# Patient Record
Sex: Male | Born: 1985 | Race: White | Hispanic: No | Smoking: Current every day smoker
Health system: Southern US, Community
[De-identification: ages and names within clinical notes are randomized; demographics above are authoritative.]

---

## 2020-06-16 ENCOUNTER — Other Ambulatory Visit: Payer: Self-pay

## 2020-06-16 ENCOUNTER — Emergency Department (HOSPITAL_COMMUNITY): Payer: BC Managed Care – PPO

## 2020-06-16 ENCOUNTER — Encounter (HOSPITAL_COMMUNITY): Payer: Self-pay | Admitting: Emergency Medicine

## 2020-06-16 ENCOUNTER — Inpatient Hospital Stay (HOSPITAL_COMMUNITY)
Admission: EM | Admit: 2020-06-16 | Discharge: 2020-06-25 | DRG: 987 | Disposition: A | Discharge status: Home Health | Payer: BC Managed Care – PPO | Source: Other Acute Inpatient Hospital | Attending: Family Medicine | Admitting: Family Medicine

## 2020-06-16 DIAGNOSIS — F10239 Alcohol dependence with withdrawal, unspecified: Secondary | ICD-10-CM

## 2020-06-16 DIAGNOSIS — F10131 Alcohol abuse with withdrawal delirium: Principal | ICD-10-CM | POA: Diagnosis present

## 2020-06-16 DIAGNOSIS — E871 Hypo-osmolality and hyponatremia: Secondary | ICD-10-CM

## 2020-06-16 DIAGNOSIS — R17 Unspecified jaundice: Secondary | ICD-10-CM

## 2020-06-16 DIAGNOSIS — F17200 Nicotine dependence, unspecified, uncomplicated: Secondary | ICD-10-CM | POA: Diagnosis present

## 2020-06-16 DIAGNOSIS — Z79899 Other long term (current) drug therapy: Secondary | ICD-10-CM

## 2020-06-16 DIAGNOSIS — E876 Hypokalemia: Secondary | ICD-10-CM

## 2020-06-16 DIAGNOSIS — K766 Portal hypertension: Secondary | ICD-10-CM | POA: Diagnosis present

## 2020-06-16 DIAGNOSIS — K709 Alcoholic liver disease, unspecified: Secondary | ICD-10-CM | POA: Diagnosis present

## 2020-06-16 DIAGNOSIS — K859 Acute pancreatitis without necrosis or infection, unspecified: Secondary | ICD-10-CM

## 2020-06-16 DIAGNOSIS — G9341 Metabolic encephalopathy: Secondary | ICD-10-CM | POA: Diagnosis present

## 2020-06-16 DIAGNOSIS — Z23 Encounter for immunization: Secondary | ICD-10-CM

## 2020-06-16 DIAGNOSIS — D539 Nutritional anemia, unspecified: Secondary | ICD-10-CM

## 2020-06-16 DIAGNOSIS — D649 Anemia, unspecified: Secondary | ICD-10-CM

## 2020-06-16 DIAGNOSIS — F10939 Alcohol use, unspecified with withdrawal, unspecified: Secondary | ICD-10-CM

## 2020-06-16 DIAGNOSIS — G9340 Encephalopathy, unspecified: Secondary | ICD-10-CM | POA: Diagnosis present

## 2020-06-16 DIAGNOSIS — F1020 Alcohol dependence, uncomplicated: Secondary | ICD-10-CM

## 2020-06-16 DIAGNOSIS — Y900 Blood alcohol level of less than 20 mg/100 ml: Secondary | ICD-10-CM | POA: Diagnosis present

## 2020-06-16 DIAGNOSIS — Z20822 Contact with and (suspected) exposure to covid-19: Secondary | ICD-10-CM | POA: Diagnosis present

## 2020-06-16 DIAGNOSIS — D696 Thrombocytopenia, unspecified: Secondary | ICD-10-CM

## 2020-06-16 DIAGNOSIS — D61818 Other pancytopenia: Secondary | ICD-10-CM | POA: Diagnosis present

## 2020-06-16 DIAGNOSIS — S0181XA Laceration without foreign body of other part of head, initial encounter: Secondary | ICD-10-CM

## 2020-06-16 LAB — COMPREHENSIVE METABOLIC PANEL
ALT: 34 U/L (ref 0–44)
AST: 107 U/L — ABNORMAL HIGH (ref 15–41)
Albumin: 3.6 g/dL (ref 3.5–5.0)
Alkaline Phosphatase: 94 U/L (ref 38–126)
Anion gap: 9 (ref 5–15)
BUN: 8 mg/dL (ref 6–20)
CO2: 25 mmol/L (ref 22–32)
Calcium: 9 mg/dL (ref 8.9–10.3)
Chloride: 98 mmol/L (ref 98–111)
Creatinine, Ser: 0.55 mg/dL — ABNORMAL LOW (ref 0.61–1.24)
GFR, Estimated: >60 mL/min (ref >60)
Glucose, Bld: 94 mg/dL (ref 70–99)
Potassium: 3.2 mmol/L — ABNORMAL LOW (ref 3.5–5.1)
Sodium: 132 mmol/L — ABNORMAL LOW (ref 135–145)
Total Bilirubin: 8.7 mg/dL — ABNORMAL HIGH (ref 0.3–1.2)
Total Protein: 8.2 g/dL — ABNORMAL HIGH (ref 6.5–8.1)

## 2020-06-16 LAB — RESP PANEL BY RT-PCR (FLU A&B, COVID) ARPGX2
Influenza A by PCR: NEGATIVE
Influenza B by PCR: NEGATIVE
SARS Coronavirus 2 by RT PCR: NEGATIVE

## 2020-06-16 LAB — ACETAMINOPHEN LEVEL: Acetaminophen (Tylenol), Serum: <10 ug/mL — ABNORMAL LOW (ref 10–30)

## 2020-06-16 LAB — CBC
HCT: 19.7 % — ABNORMAL LOW (ref 39.0–52.0)
Hemoglobin: 6.6 g/dL — CL (ref 13.0–17.0)
MCH: 35.1 pg — ABNORMAL HIGH (ref 26.0–34.0)
MCHC: 33.5 g/dL (ref 30.0–36.0)
MCV: 104.8 fL — ABNORMAL HIGH (ref 80.0–100.0)
Platelets: 16 10*3/uL — CL (ref 150–400)
RBC: 1.88 MIL/uL — ABNORMAL LOW (ref 4.22–5.81)
RDW: 15.8 % — ABNORMAL HIGH (ref 11.5–15.5)
WBC: 3.8 10*3/uL — ABNORMAL LOW (ref 4.0–10.5)
nRBC: 0 % (ref 0.0–0.2)

## 2020-06-16 LAB — ETHANOL: Alcohol, Ethyl (B): <10 mg/dL (ref <10)

## 2020-06-16 LAB — POC OCCULT BLOOD, ED: Fecal Occult Bld: NEGATIVE

## 2020-06-16 LAB — SALICYLATE LEVEL: Salicylate Lvl: <7 mg/dL — ABNORMAL LOW (ref 7.0–30.0)

## 2020-06-16 MED ORDER — ONDANSETRON HCL 4 MG/2ML IJ SOLN
INTRAMUSCULAR | Status: AC
  Administered 2020-06-16: 4 mg via INTRAVENOUS
  Filled 2020-06-16: qty 2

## 2020-06-16 MED ORDER — THIAMINE HCL 100 MG PO TABS: 100.0000 mg | ORAL_TABLET | Freq: Every day | ORAL | Status: DC

## 2020-06-16 MED ORDER — LORAZEPAM 2 MG/ML IJ SOLN
0.0000 mg | Freq: Four times a day (QID) | INTRAMUSCULAR | Status: AC
  Administered 2020-06-16: 2 mg via INTRAVENOUS
  Administered 2020-06-17: 4 mg via INTRAVENOUS
  Administered 2020-06-18: 1 mg via INTRAVENOUS
  Administered 2020-06-18 (×2): 2 mg via INTRAVENOUS
  Filled 2020-06-16: qty 1
  Filled 2020-06-16: qty 2
  Filled 2020-06-16 (×3): qty 1

## 2020-06-16 MED ORDER — LORAZEPAM 2 MG/ML IJ SOLN: 0.0000 mg | Freq: Two times a day (BID) | INTRAMUSCULAR | Status: AC

## 2020-06-16 MED ORDER — ONDANSETRON HCL 4 MG/2ML IJ SOLN: 4.0000 mg | Freq: Once | INTRAMUSCULAR | Status: AC

## 2020-06-16 MED ORDER — THIAMINE HCL 100 MG/ML IJ SOLN
100.0000 mg | Freq: Every day | INTRAMUSCULAR | Status: DC
  Administered 2020-06-16: 100 mg via INTRAVENOUS
  Filled 2020-06-16: qty 2

## 2020-06-16 MED ORDER — SODIUM CHLORIDE 0.9% IV SOLUTION: Freq: Once | INTRAVENOUS | Status: AC

## 2020-06-16 MED ORDER — LORAZEPAM 1 MG PO TABS
0.0000 mg | ORAL_TABLET | Freq: Two times a day (BID) | ORAL | Status: AC
  Administered 2020-06-19: 1 mg via ORAL
  Administered 2020-06-20: 2 mg via ORAL
  Filled 2020-06-16: qty 4
  Filled 2020-06-16: qty 1

## 2020-06-16 MED ORDER — LORAZEPAM 1 MG PO TABS
0.0000 mg | ORAL_TABLET | Freq: Four times a day (QID) | ORAL | Status: AC
  Administered 2020-06-17 (×2): 1 mg via ORAL
  Filled 2020-06-16 (×2): qty 1

## 2020-06-16 MED ORDER — TETANUS-DIPHTH-ACELL PERTUSSIS 5-2.5-18.5 LF-MCG/0.5 IM SUSY
0.5000 mL | PREFILLED_SYRINGE | Freq: Once | INTRAMUSCULAR | Status: AC
  Administered 2020-06-17: 0.5 mL via INTRAMUSCULAR
  Filled 2020-06-16: qty 0.5

## 2020-06-16 NOTE — ED Notes (Signed)
Patient has tremors and is stuttering. Patients last drink was Saturday. Patient was sent from a rehab facility.

## 2020-06-16 NOTE — ED Triage Notes (Signed)
Brought in by EMS from [facility]hab [facility] Been there since Saturday for alcohol use, pt has not had any alcohol since Saturday. Been on librium and valium for DTs. Valium at 1600. Hx of seizures when going through alcohol withdrawals, no seizures with this withdrawal episode.
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
## 2020-06-17 ENCOUNTER — Encounter (HOSPITAL_COMMUNITY): Payer: Self-pay | Admitting: Family Medicine

## 2020-06-17 ENCOUNTER — Inpatient Hospital Stay (HOSPITAL_COMMUNITY): Payer: BC Managed Care – PPO

## 2020-06-17 DIAGNOSIS — F10939 Alcohol use, unspecified with withdrawal, unspecified: Secondary | ICD-10-CM

## 2020-06-17 DIAGNOSIS — Z79899 Other long term (current) drug therapy: Secondary | ICD-10-CM | POA: Diagnosis not present

## 2020-06-17 DIAGNOSIS — K709 Alcoholic liver disease, unspecified: Secondary | ICD-10-CM | POA: Diagnosis present

## 2020-06-17 DIAGNOSIS — F17200 Nicotine dependence, unspecified, uncomplicated: Secondary | ICD-10-CM | POA: Diagnosis present

## 2020-06-17 DIAGNOSIS — G9341 Metabolic encephalopathy: Secondary | ICD-10-CM | POA: Diagnosis present

## 2020-06-17 DIAGNOSIS — Z20822 Contact with and (suspected) exposure to covid-19: Secondary | ICD-10-CM | POA: Diagnosis present

## 2020-06-17 DIAGNOSIS — K766 Portal hypertension: Secondary | ICD-10-CM | POA: Diagnosis present

## 2020-06-17 DIAGNOSIS — Z23 Encounter for immunization: Secondary | ICD-10-CM | POA: Diagnosis not present

## 2020-06-17 DIAGNOSIS — G9340 Encephalopathy, unspecified: Secondary | ICD-10-CM | POA: Diagnosis present

## 2020-06-17 DIAGNOSIS — D539 Nutritional anemia, unspecified: Secondary | ICD-10-CM | POA: Diagnosis present

## 2020-06-17 DIAGNOSIS — S0181XA Laceration without foreign body of other part of head, initial encounter: Secondary | ICD-10-CM | POA: Diagnosis present

## 2020-06-17 DIAGNOSIS — F10131 Alcohol abuse with withdrawal delirium: Secondary | ICD-10-CM | POA: Diagnosis present

## 2020-06-17 DIAGNOSIS — E876 Hypokalemia: Secondary | ICD-10-CM

## 2020-06-17 DIAGNOSIS — K859 Acute pancreatitis without necrosis or infection, unspecified: Secondary | ICD-10-CM

## 2020-06-17 DIAGNOSIS — E871 Hypo-osmolality and hyponatremia: Secondary | ICD-10-CM

## 2020-06-17 DIAGNOSIS — F10239 Alcohol dependence with withdrawal, unspecified: Secondary | ICD-10-CM | POA: Diagnosis not present

## 2020-06-17 DIAGNOSIS — D61818 Other pancytopenia: Secondary | ICD-10-CM | POA: Diagnosis present

## 2020-06-17 DIAGNOSIS — D696 Thrombocytopenia, unspecified: Secondary | ICD-10-CM

## 2020-06-17 DIAGNOSIS — Y900 Blood alcohol level of less than 20 mg/100 ml: Secondary | ICD-10-CM | POA: Diagnosis present

## 2020-06-17 DIAGNOSIS — R17 Unspecified jaundice: Secondary | ICD-10-CM

## 2020-06-17 LAB — TSH: TSH: 1.276 u[IU]/mL (ref 0.350–4.500)

## 2020-06-17 LAB — COMPREHENSIVE METABOLIC PANEL
ALT: 37 U/L (ref 0–44)
AST: 109 U/L — ABNORMAL HIGH (ref 15–41)
Albumin: 3.8 g/dL (ref 3.5–5.0)
Alkaline Phosphatase: 98 U/L (ref 38–126)
Anion gap: 10 (ref 5–15)
BUN: 8 mg/dL (ref 6–20)
CO2: 26 mmol/L (ref 22–32)
Calcium: 9.6 mg/dL (ref 8.9–10.3)
Chloride: 97 mmol/L — ABNORMAL LOW (ref 98–111)
Creatinine, Ser: 0.71 mg/dL (ref 0.61–1.24)
GFR, Estimated: >60 mL/min (ref >60)
Glucose, Bld: 106 mg/dL — ABNORMAL HIGH (ref 70–99)
Potassium: 3.2 mmol/L — ABNORMAL LOW (ref 3.5–5.1)
Sodium: 133 mmol/L — ABNORMAL LOW (ref 135–145)
Total Bilirubin: 9 mg/dL — ABNORMAL HIGH (ref 0.3–1.2)
Total Protein: 8.4 g/dL — ABNORMAL HIGH (ref 6.5–8.1)

## 2020-06-17 LAB — PROTIME-INR
INR: 1.9 — ABNORMAL HIGH (ref 0.8–1.2)
Prothrombin Time: 22.1 seconds — ABNORMAL HIGH (ref 11.4–15.2)

## 2020-06-17 LAB — URINALYSIS, ROUTINE W REFLEX MICROSCOPIC
Bilirubin Urine: NEGATIVE
Glucose, UA: NEGATIVE mg/dL
Hgb urine dipstick: NEGATIVE
Ketones, ur: NEGATIVE mg/dL
Leukocytes,Ua: NEGATIVE
Nitrite: NEGATIVE
Protein, ur: NEGATIVE mg/dL
Specific Gravity, Urine: 1.008 (ref 1.005–1.030)
pH: 8 (ref 5.0–8.0)

## 2020-06-17 LAB — DIFFERENTIAL
Abs Immature Granulocytes: 0.03 10*3/uL (ref 0.00–0.07)
Basophils Absolute: 0 10*3/uL (ref 0.0–0.1)
Basophils Relative: 1 %
Eosinophils Absolute: 0.1 10*3/uL (ref 0.0–0.5)
Eosinophils Relative: 2 %
Immature Granulocytes: 1 %
Lymphocytes Relative: 23 %
Lymphs Abs: 0.9 10*3/uL (ref 0.7–4.0)
Monocytes Absolute: 0.6 10*3/uL (ref 0.1–1.0)
Monocytes Relative: 14 %
Neutro Abs: 2.4 10*3/uL (ref 1.7–7.7)
Neutrophils Relative %: 59 %

## 2020-06-17 LAB — RAPID URINE DRUG SCREEN, HOSP PERFORMED
Amphetamines: NOT DETECTED
Barbiturates: NOT DETECTED
Benzodiazepines: POSITIVE — AB
Cocaine: NOT DETECTED
Opiates: NOT DETECTED
Tetrahydrocannabinol: POSITIVE — AB

## 2020-06-17 LAB — LIPASE, BLOOD: Lipase: 67 U/L — ABNORMAL HIGH (ref 11–51)

## 2020-06-17 LAB — CBC
HCT: 19.7 % — ABNORMAL LOW (ref 39.0–52.0)
HCT: 22.6 % — ABNORMAL LOW (ref 39.0–52.0)
Hemoglobin: 6.6 g/dL — CL (ref 13.0–17.0)
Hemoglobin: 7.7 g/dL — ABNORMAL LOW (ref 13.0–17.0)
MCH: 34 pg (ref 26.0–34.0)
MCH: 33.6 pg (ref 26.0–34.0)
MCHC: 33.5 g/dL (ref 30.0–36.0)
MCHC: 34.1 g/dL (ref 30.0–36.0)
MCV: 101.5 fL — ABNORMAL HIGH (ref 80.0–100.0)
MCV: 98.7 fL (ref 80.0–100.0)
Platelets: 19 10*3/uL — CL (ref 150–400)
Platelets: 38 10*3/uL — ABNORMAL LOW (ref 150–400)
RBC: 1.94 MIL/uL — ABNORMAL LOW (ref 4.22–5.81)
RBC: 2.29 MIL/uL — ABNORMAL LOW (ref 4.22–5.81)
RDW: 15.8 % — ABNORMAL HIGH (ref 11.5–15.5)
RDW: 19.7 % — ABNORMAL HIGH (ref 11.5–15.5)
WBC: 4.4 10*3/uL (ref 4.0–10.5)
WBC: 4.6 10*3/uL (ref 4.0–10.5)
nRBC: 0 % (ref 0.0–0.2)
nRBC: 0 % (ref 0.0–0.2)

## 2020-06-17 LAB — GLUCOSE, CAPILLARY: Glucose-Capillary: 218 mg/dL — ABNORMAL HIGH (ref 70–99)

## 2020-06-17 LAB — APTT: aPTT: 41 seconds — ABNORMAL HIGH (ref 24–36)

## 2020-06-17 LAB — HIV ANTIBODY (ROUTINE TESTING W REFLEX): HIV Screen 4th Generation wRfx: NONREACTIVE

## 2020-06-17 LAB — VITAMIN B12: Vitamin B-12: 1248 pg/mL — ABNORMAL HIGH (ref 180–914)

## 2020-06-17 LAB — PREPARE RBC (CROSSMATCH)

## 2020-06-17 LAB — FOLATE: Folate: 7.6 ng/mL (ref >5.9)

## 2020-06-17 LAB — MAGNESIUM: Magnesium: 1.2 mg/dL — ABNORMAL LOW (ref 1.7–2.4)

## 2020-06-17 LAB — ABO/RH: ABO/RH(D): O POS

## 2020-06-17 MED ORDER — LORAZEPAM 1 MG PO TABS
1.0000 mg | ORAL_TABLET | ORAL | Status: AC | PRN
  Administered 2020-06-17: 2 mg via ORAL
  Administered 2020-06-19: 1 mg via ORAL
  Filled 2020-06-17: qty 2
  Filled 2020-06-17: qty 1

## 2020-06-17 MED ORDER — ADULT MULTIVITAMIN W/MINERALS CH
1.0000 | ORAL_TABLET | Freq: Every day | ORAL | Status: DC
  Administered 2020-06-17 – 2020-06-25 (×9): 1 via ORAL
  Filled 2020-06-17 (×9): qty 1

## 2020-06-17 MED ORDER — SERTRALINE HCL 50 MG PO TABS
50.0000 mg | ORAL_TABLET | Freq: Every day | ORAL | Status: DC
  Administered 2020-06-17 – 2020-06-25 (×9): 50 mg via ORAL
  Filled 2020-06-17 (×9): qty 1

## 2020-06-17 MED ORDER — ONDANSETRON HCL 4 MG PO TABS: 4.0000 mg | ORAL_TABLET | Freq: Four times a day (QID) | ORAL | Status: DC | PRN

## 2020-06-17 MED ORDER — SODIUM CHLORIDE 0.9 % IV SOLN: INTRAVENOUS | Status: DC

## 2020-06-17 MED ORDER — SODIUM CHLORIDE 0.9 % IV SOLN
8.0000 mg/h | INTRAVENOUS | Status: DC
  Administered 2020-06-17 – 2020-06-19 (×5): 8 mg/h via INTRAVENOUS
  Filled 2020-06-17 (×5): qty 80

## 2020-06-17 MED ORDER — POTASSIUM CHLORIDE CRYS ER 20 MEQ PO TBCR
40.0000 meq | EXTENDED_RELEASE_TABLET | Freq: Once | ORAL | Status: AC
  Administered 2020-06-17: 40 meq via ORAL
  Filled 2020-06-17: qty 2

## 2020-06-17 MED ORDER — LORAZEPAM 2 MG/ML IJ SOLN
6.0000 mg | Freq: Once | INTRAMUSCULAR | Status: DC
  Filled 2020-06-17: qty 3

## 2020-06-17 MED ORDER — CHLORDIAZEPOXIDE HCL 25 MG PO CAPS
25.0000 mg | ORAL_CAPSULE | Freq: Three times a day (TID) | ORAL | Status: DC
  Administered 2020-06-17 – 2020-06-25 (×26): 25 mg via ORAL
  Filled 2020-06-17 (×10): qty 1
  Filled 2020-06-17: qty 5
  Filled 2020-06-17 (×16): qty 1

## 2020-06-17 MED ORDER — ACETAMINOPHEN 325 MG PO TABS
650.0000 mg | ORAL_TABLET | Freq: Four times a day (QID) | ORAL | Status: DC | PRN
  Administered 2020-06-25: 650 mg via ORAL
  Filled 2020-06-17: qty 2

## 2020-06-17 MED ORDER — SODIUM CHLORIDE 0.9 % IV SOLN
80.0000 mg | Freq: Once | INTRAVENOUS | Status: AC
  Administered 2020-06-17: 80 mg via INTRAVENOUS
  Filled 2020-06-17: qty 80

## 2020-06-17 MED ORDER — TRAZODONE HCL 50 MG PO TABS
50.0000 mg | ORAL_TABLET | Freq: Every day | ORAL | Status: DC
  Administered 2020-06-17 – 2020-06-24 (×8): 50 mg via ORAL
  Filled 2020-06-17 (×8): qty 1

## 2020-06-17 MED ORDER — SODIUM CHLORIDE 0.9 % IV SOLN
50.0000 ug/h | INTRAVENOUS | Status: DC
  Administered 2020-06-17 – 2020-06-19 (×5): 50 ug/h via INTRAVENOUS
  Filled 2020-06-17 (×6): qty 1

## 2020-06-17 MED ORDER — FOLIC ACID 1 MG PO TABS
1.0000 mg | ORAL_TABLET | Freq: Every day | ORAL | Status: DC
  Administered 2020-06-17 – 2020-06-25 (×9): 1 mg via ORAL
  Filled 2020-06-17 (×9): qty 1

## 2020-06-17 MED ORDER — NICOTINE 14 MG/24HR TD PT24
14.0000 mg | MEDICATED_PATCH | Freq: Every day | TRANSDERMAL | Status: DC
  Administered 2020-06-18 – 2020-06-25 (×8): 14 mg via TRANSDERMAL
  Filled 2020-06-17 (×9): qty 1

## 2020-06-17 MED ORDER — THIAMINE HCL 100 MG/ML IJ SOLN: 100.0000 mg | Freq: Every day | INTRAMUSCULAR | Status: DC

## 2020-06-17 MED ORDER — PANTOPRAZOLE SODIUM 40 MG IV SOLR
40.0000 mg | Freq: Two times a day (BID) | INTRAVENOUS | Status: DC
  Administered 2020-06-20 – 2020-06-22 (×4): 40 mg via INTRAVENOUS
  Filled 2020-06-17 (×4): qty 40

## 2020-06-17 MED ORDER — THIAMINE HCL 100 MG PO TABS
100.0000 mg | ORAL_TABLET | Freq: Every day | ORAL | Status: DC
  Administered 2020-06-17 – 2020-06-25 (×9): 100 mg via ORAL
  Filled 2020-06-17 (×9): qty 1

## 2020-06-17 MED ORDER — MAGNESIUM SULFATE 4 GM/100ML IV SOLN
4.0000 g | Freq: Once | INTRAVENOUS | Status: AC
  Administered 2020-06-17: 4 g via INTRAVENOUS
  Filled 2020-06-17: qty 100

## 2020-06-17 MED ORDER — HALOPERIDOL LACTATE 5 MG/ML IJ SOLN
5.0000 mg | Freq: Once | INTRAMUSCULAR | Status: AC
  Administered 2020-06-17: 5 mg via INTRAMUSCULAR
  Filled 2020-06-17: qty 1

## 2020-06-17 MED ORDER — LACTATED RINGERS IV SOLN: INTRAVENOUS | Status: DC

## 2020-06-17 MED ORDER — LORAZEPAM 2 MG/ML IJ SOLN
1.0000 mg | INTRAMUSCULAR | Status: AC | PRN
  Administered 2020-06-17: 3 mg via INTRAVENOUS
  Administered 2020-06-17: 4 mg via INTRAVENOUS
  Administered 2020-06-18 – 2020-06-20 (×3): 1 mg via INTRAVENOUS
  Filled 2020-06-17 (×2): qty 1
  Filled 2020-06-17: qty 2
  Filled 2020-06-17: qty 1

## 2020-06-17 MED ORDER — ONDANSETRON HCL 4 MG/2ML IJ SOLN: 4.0000 mg | Freq: Four times a day (QID) | INTRAMUSCULAR | Status: DC | PRN

## 2020-06-17 MED ORDER — ACETAMINOPHEN 650 MG RE SUPP: 650.0000 mg | Freq: Four times a day (QID) | RECTAL | Status: DC | PRN

## 2020-06-17 NOTE — ED Notes (Signed)
Platelets started at [phone]

## 2020-06-17 NOTE — ED Notes (Signed)
Pt unable to sign blood consent due to AMS. This RN and charge RN signed blood consent

## 2020-06-17 NOTE — ED Notes (Signed)
Notified Md of ciwa score. MD gave verbal order to give patient 6mg  of ativan

## 2020-06-17 NOTE — ED Notes (Signed)
MD notified of Ciwa Score

## 2020-06-17 NOTE — ED Notes (Signed)
[facility] called to check on to see if patient is admitted yet. Explained waiting on a bed

## 2020-06-17 NOTE — Progress Notes (Signed)
Notified by RN that pt very agitated and pulling off telemetry monitor, pulled out IV.  Has been given ativan but not helping.  Given Haldol 5 mg IM now.

## 2020-06-17 NOTE — Progress Notes (Signed)
Brief same-day note  Patient is a 35 year old male with history of chronic alcohol abuse who was sent from alcohol detox facility due to concerns for DTs.  He did not respond to Librium and Valium at detox facility.  He was drinking 10 glasses of bourbon a day. On presentation he was hemodynamically stable.  He was found to have multiple bruises on his extremities and around his body.  Multiple imagings done did not show any acute finding.  Liver imaging showed splenomegaly, portal hypertension, fatty infiltration versus diffuse hepatocellular disease.  CT abdomen showed possible mild acute pancreatitis, he had mild elevated lipase Lab work showed hemoglobin in the range of 6, severe thrombocytopenia of 16,000.  PRBC, platelets were transfused.  Lab work also showed hemoglobin of 8.  UDS was positive for marijuana and benzos. Patient was admitted for further management.  He was started on CIWA protocol.  We have requested GI consultation today for evaluation of possible underlying esophageal varices.  His hemoglobin was 11.5 on 04/28/2020.  GI planning for EGD, currently he is on clear liquid diet. Patient seen and examined at the bedside this morning.  During my evaluation, he was hemodynamically stable, sedated/sleeping.  Mom was at the bedside. Examination revealed mildly distended abdomen, lungs were clear. He has been started on octreotide and Protonix drip.  We will continue current management

## 2020-06-17 NOTE — ED Notes (Signed)
No signs of transfusion reaction noted within first 15 minutes of platelet administration. Infusion is to be administered within 1 hour of starting per order. Rate increased to 319mL/hr to infuse remaining in 45 minutes

## 2020-06-17 NOTE — ED Notes (Signed)
Will check cbc 4hrs post transfusion per MD [facility]
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
## 2020-06-17 NOTE — ED Notes (Signed)
Patient has a swollen right elbow. Elbow is purple and blue. Patient has a right black eye. Patient has bruised knees. Patient has multiple bruises all over body.

## 2020-06-17 NOTE — ED Notes (Signed)
[facility] nurse called for update on patient

## 2020-06-17 NOTE — ED Notes (Signed)
Talked to mom and gave update.

## 2020-06-17 NOTE — ED Notes (Signed)
Lab called regarding PRBC and Platelets. Lab stated they needed another pink top tube to type and screen the pt. Type and screen has already been sent prior in the shift and arm band is present on pt. Another pink top will be drawn and sent

## 2020-06-17 NOTE — ED Notes (Signed)
US at bedside
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
## 2020-06-17 NOTE — ED Notes (Signed)
Phone order from Dr. Rachael Darby to administer 6mg  of IV ativan. Medication not administered. Pt CIWA reassessed after going into room and he meets criteria for 4mg  ativan IV only. Refer to [facility]

## 2020-06-18 ENCOUNTER — Inpatient Hospital Stay (HOSPITAL_COMMUNITY): Payer: BC Managed Care – PPO

## 2020-06-18 ENCOUNTER — Encounter (HOSPITAL_COMMUNITY): Admission: EM | Disposition: A | Discharge status: Home Health | Payer: Self-pay | Attending: Family Medicine

## 2020-06-18 DIAGNOSIS — S0181XA Laceration without foreign body of other part of head, initial encounter: Secondary | ICD-10-CM

## 2020-06-18 LAB — TYPE AND SCREEN
ABO/RH(D): O POS
Antibody Screen: NEGATIVE
Unit division: 0

## 2020-06-18 LAB — COMPREHENSIVE METABOLIC PANEL
ALT: 31 U/L (ref 0–44)
AST: 73 U/L — ABNORMAL HIGH (ref 15–41)
Albumin: 3.4 g/dL — ABNORMAL LOW (ref 3.5–5.0)
Alkaline Phosphatase: 94 U/L (ref 38–126)
Anion gap: 10 (ref 5–15)
BUN: 6 mg/dL (ref 6–20)
CO2: 26 mmol/L (ref 22–32)
Calcium: 9.2 mg/dL (ref 8.9–10.3)
Chloride: 98 mmol/L (ref 98–111)
Creatinine, Ser: 0.69 mg/dL (ref 0.61–1.24)
GFR, Estimated: >60 mL/min (ref >60)
Glucose, Bld: 125 mg/dL — ABNORMAL HIGH (ref 70–99)
Potassium: 3.7 mmol/L (ref 3.5–5.1)
Sodium: 134 mmol/L — ABNORMAL LOW (ref 135–145)
Total Bilirubin: 8.6 mg/dL — ABNORMAL HIGH (ref 0.3–1.2)
Total Protein: 7.7 g/dL (ref 6.5–8.1)

## 2020-06-18 LAB — CBC
HCT: 22.4 % — ABNORMAL LOW (ref 39.0–52.0)
Hemoglobin: 7.7 g/dL — ABNORMAL LOW (ref 13.0–17.0)
MCH: 34.2 pg — ABNORMAL HIGH (ref 26.0–34.0)
MCHC: 34.4 g/dL (ref 30.0–36.0)
MCV: 99.6 fL (ref 80.0–100.0)
Platelets: 47 10*3/uL — ABNORMAL LOW (ref 150–400)
RBC: 2.25 MIL/uL — ABNORMAL LOW (ref 4.22–5.81)
RDW: 19.9 % — ABNORMAL HIGH (ref 11.5–15.5)
WBC: 4.8 10*3/uL (ref 4.0–10.5)
nRBC: 0 % (ref 0.0–0.2)

## 2020-06-18 LAB — PROTIME-INR
INR: 1.8 — ABNORMAL HIGH (ref 0.8–1.2)
Prothrombin Time: 21.2 seconds — ABNORMAL HIGH (ref 11.4–15.2)

## 2020-06-18 LAB — BPAM PLATELET PHERESIS
Blood Product Expiration Date: 202205262359
Blood Product Expiration Date: 202205262359
ISSUE DATE / TIME: 202205240633
ISSUE DATE / TIME: 202205240740
Unit Type and Rh: 5100
Unit Type and Rh: 6200

## 2020-06-18 LAB — PREPARE PLATELET PHERESIS
Unit division: 0
Unit division: 0

## 2020-06-18 LAB — BPAM RBC
Blood Product Expiration Date: 202206182359
ISSUE DATE / TIME: 202205240928
Unit Type and Rh: 5100

## 2020-06-18 SURGERY — EGD (ESOPHAGOGASTRODUODENOSCOPY)
Anesthesia: Monitor Anesthesia Care

## 2020-06-18 MED ORDER — LIDOCAINE HCL 1 % IJ SOLN
20.0000 mL | Freq: Once | INTRAMUSCULAR | Status: AC
  Administered 2020-06-18: 20 mL via INTRADERMAL
  Filled 2020-06-18: qty 20

## 2020-06-18 MED ORDER — SODIUM CHLORIDE 0.9 % IV SOLN: INTRAVENOUS | Status: DC | PRN

## 2020-06-18 NOTE — Progress Notes (Addendum)
Notified by RN that pt tried to get up and go to the bathroom on his own and fell. He fell face first and sustained a laceration of his chin. He fell at foot of bed and unclear what he hit except the floor. No reported LOC. Sterile gauze applied while holding pressure but continues to bleed. Bleeding has slowed down per RN. Has small laceration on his tongue as well. He pulled out his IV when he fell.   Vital signs are stable.   Chin-2 cm laceration through the skin and underlying muscle with clot. Laceration is flushed with sterile saline 50 ml.  Annia Friendly is trimmed. Lidocaine 1% 1 1/2 ml used to anesthesize edges of laceration.  4 interrupted sutures with 4-0 prolene placed. Good approximation of edges of laceration. Bleeding stopped.  Dressing placed.   Pt to go for CT head and face. Monitor vitals every 30 minutes for 2 hours. Safety sitter ordered.

## 2020-06-18 NOTE — Significant Event (Signed)
Rapid Response Event Note   Reason for Call :  Patient got out of bed unassisted and into bathroom where he states he fell face first. Unsure exactly where he hit with fall. Patient found at foot of the bed Patient has large gash on chin that is bleeding. Patient also pulled out IV.   Initial Focused Assessment:  CIWA protocol already in place. Patient has extensive bruising all over body from previous traumas sustained prior to admission. Patient oriented to person, but needed reorientation to time, situation, and place. Patient appears to have bitten his tongue and sustained a laceration to his chin. Both are bleeding, but this has slowed somewhat with holding pressure. Vitals are: 98.8 F oral temp, HR 82, RR 16, BP 112/68. Monitoring vitals every thirty minutes.    Interventions:  Full RN assessment. Dr. Rachael Darby came to bedside to further assess chin trauma and place sutures. After sutures placed, patient will go for CT scan of face and head per Dr. Rachael Darby. IV team to attempt replacement of IV.    Plan of Care:  Full safety precautions including 1:1 safety sitter and bed alarm.  Event Summary:   MD Notified: Dr. Rachael Darby  Call Time: 0149 Arrival Time: 0155 End Time: 0242  Lamona Curl, RN

## 2020-06-18 NOTE — Progress Notes (Addendum)
   06/18/20 0140  What Happened  Was fall witnessed? No  Was patient injured? Yes  Patient found on floor  Found by Staff-comment (Nathan Osborne)  Stated prior activity bathroom-unassisted  Follow Up  MD notified [name]  Time MD notified [phone]  Family notified No - patient refusal  Additional tests Yes-comment (CT of head and face)  Simple treatment Dressing  Progress note created (see row info) Yes  Adult Fall Risk Assessment  Risk Factor Category (scoring not indicated) Fall has occurred during this admission (document High fall risk)  Patient Fall Risk Level High fall risk  Adult Fall Risk Interventions  Required Bundle Interventions *See Row Information* High fall risk - low, moderate, and high requirements implemented  Additional Interventions Use of appropriate toileting equipment (bedpan, BSC, etc.)  Screening for Fall Injury Risk (To be completed on HIGH fall risk patients) - Assessing Need for Floor Mats  Risk For Fall Injury- Criteria for Floor Mats Previous fall this admission  Will Implement Floor Mats Yes  Pain Assessment  Pain Scale 0-10  Pain Score 0  Neurological  Neuro (WDL) X  Level of Consciousness Alert  Orientation Level Oriented to person;Oriented to time;Disoriented to place;Disoriented to situation  Cognition Poor attention/concentration;Poor judgement;Poor safety awareness;Memory impairment  Speech Clear  Pupil Assessment  No  Neuro Symptoms Anxiety;Tremors  Neuro symptoms relieved by Anti-anxiety medication;Rest  Glasgow Coma Scale  Eye Opening 4  Best Verbal Response (NON-intubated) 4  Best Motor Response 6  Musculoskeletal  Musculoskeletal (WDL) X  Assistive Device BSC  Generalized Weakness Yes  Musculoskeletal Details  RLE Weakness  LLE Weakness  Integumentary  Integumentary (WDL) X  Skin Color Jaundice  Skin Condition Dry  Skin Integrity Abrasion;Ecchymosis (gash under chin)  Abrasion Location Face (chin)  Abrasion Location  Orientation Lower  Abrasion Intervention Cleansed;Gauze;Other (Comment) (assessed)  Ecchymosis Location Arm;Back;Eye;Leg;Lip  Ecchymosis Location Orientation Right;Left  Ecchymosis Intervention Other (Comment) (assessed)  Skin Turgor Non-tenting   Vitals  Temp: 98.8 axillary Left arm BP: 126/76 with MAP of 90 Pulse: 90 RR: 16 SpO2: 95% on room air No complaints of pain  Gash under the chin after pt states he fell on his face trying to go to the bathroom unassisted.  Lidocaine was used to numb the area and 4 sutures were placed by B. Chotiner.   Charge nurse at the bedside as well.

## 2020-06-18 NOTE — Progress Notes (Signed)
Liver chemistries are similar to yesterday, with moderate elevation of AST, and bilirubin of 8.6.  Renal function remains normal.    Hemoglobin remaining steady at 7.7, and the patient's platelet count has risen from 38,000 to 47,000 without further platelet transfusion, which could represent lab variation but I hope is indicative of diminishing bone marrow suppression as he gets farther out from his alcohol exposure (last exposure 4 days ago).  Patient is heavily sedated so I did not examine him or interview him today.  His mother is at the bedside, however, and we discussed the patient's prior alcohol history.  It sounds as though he had a DUI while in college, but more consistent drinking became an issue in the past several years in association with the pandemic and the associated stress from that and social difficulties with his girlfriend who moved in with him along with her 2 rowdy children.  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  We will follow at a distance since it does not appear that there is an acute GI problem.  Once the patient is medically stable, it would be reasonable to consider endoscopy and colonoscopy for evaluation of his anemia despite the fact that he was Hemoccult negative on admission.  Please call us at any time if earlier input is desired.  Florencia Reasons, M.D. Pager [phone] If no answer or after 5 PM call [phone]

## 2020-06-18 NOTE — Progress Notes (Signed)
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 stranding and haziness the pancreatic contour. No main pancreatic duct dilatation. Spleen: Enlarged measuring up to 15 cm.  No focal lesion. Adrenal Glands: No nodularity bilaterally. 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 No hydroureteronephrosis. No nephroureterolithiasis. No contour deforming renal mass. The urinary bladder is unremarkable. Bowel: No small or large bowel wall thickening or dilatation. The appendix is unremarkable. Mesentery, Omentum, and Peritoneum: No simple free fluid ascites. No pneumoperitoneum. No mesenteric hematoma identified. No organized fluid collection. Pelvic Organs: Normal. Lymph Nodes: No abdominal, pelvic, inguinal lymphadenopathy. Vasculature: No abdominal aorta or iliac aneurysm. Musculoskeletal: No significant soft tissue hematoma. No acute pelvic fracture. No spinal fracture. IMPRESSION: 1. No acute intracranial abnormality. 2. No acute  displaced fracture or traumatic listhesis of the cervical spine. 3. No acute traumatic injury to the chest, abdomen, or pelvis with limited evaluation on this noncontrast study. 4. No acute fracture or traumatic malalignment of the thoracic or lumbar spine. 5. Question mild acute pancreatitis. Recommend correlation with lipase levels. 6. Question nonspecific gallbladder wall thickening. 7. Splenomegaly. 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 9. Small hiatal hernia. 10. Anemia. Electronically Signed   By: Tish Frederickson M.D.   On: 06/17/2020 00:10   CT MAXILLOFACIAL WO CONTRAST  Result Date: 06/18/2020 CLINICAL DATA:  Fall, facial trauma, facial laceration, altered mental status EXAM: CT HEAD WITHOUT CONTRAST CT MAXILLOFACIAL WITHOUT CONTRAST TECHNIQUE: Multidetector CT imaging of the head and maxillofacial structures were performed using the standard protocol without intravenous contrast. Multiplanar CT image reconstructions of the maxillofacial structures were also generated. COMPARISON:  None. FINDINGS: CT HEAD FINDINGS Brain: There is mild diffuse cerebral atrophy, greater than would be expected for patient age. No abnormal intra or extra-axial mass lesion or fluid collection. No abnormal mass effect or midline shift. No evidence of acute intracranial hemorrhage or infarct. Ventricular size is normal. Cerebellum unremarkable. Vascular: Unremarkable Skull: Intact Sinuses/Orbits: Paranasal sinuses are clear. Orbits are unremarkable. Other: There is fluid opacification of the right mastoid air cells and middle ear cavity. Left mastoid air cells and middle ear cavity are clear. CT MAXILLOFACIAL FINDINGS Osseous: No fracture or mandibular dislocation. No destructive process. Orbits: Negative. No traumatic or inflammatory finding. Sinuses: Clear. Soft tissues: There is mild soft tissue swelling and subcutaneous gas seen superficial to the mandibular mentum. IMPRESSION: No acute intracranial injury.  No  calvarial fracture. Mild diffuse cerebral atrophy, greater than would be expected for patient age. No acute facial fracture.  Mild soft tissue swelling superficial to the mandibular mentum. Electronically Signed   By: Helyn NumbersAshesh  Parikh MD   On: 06/18/2020 03:51        Scheduled Meds: . chlordiazePOXIDE  25 mg Oral TID  . folic acid  1 mg Oral Daily  . LORazepam  0-4 mg Intravenous Q6H   Or  . LORazepam  0-4 mg Oral Q6H  . [START ON 06/19/2020] LORazepam  0-4 mg Intravenous Q12H   Or  . [START ON 06/19/2020] LORazepam  0-4 mg Oral Q12H  . LORazepam  6 mg Intravenous Once  . multivitamin with minerals  1 tablet Oral Daily  . nicotine  14 mg Transdermal Daily  . [START ON 06/20/2020] pantoprazole  40 mg Intravenous Q12H  . sertraline  50 mg Oral Daily  . thiamine  100 mg Oral Daily   Or  . thiamine  100 mg Intravenous Daily  . traZODone  50 mg Oral QHS   Continuous Infusions: . sodium chloride    . lactated ringers 100 mL/hr at 06/17/20 1909  . octreotide  (SANDOSTATIN)    IV infusion 50 mcg/hr (06/18/20 0300)  . pantoprozole (PROTONIX) infusion Stopped (06/18/20 0138)     LOS: 1 day    Time spent: 35 mins    Evian Derringer, MD [facility]   If 7PM-7AM, please contact night-coverage

## 2020-06-18 NOTE — Progress Notes (Signed)
The patient's nurse and the CN transported the patient to CT in Radiology.

## 2020-06-19 LAB — CBC
HCT: 24.1 % — ABNORMAL LOW (ref 39.0–52.0)
Hemoglobin: 8 g/dL — ABNORMAL LOW (ref 13.0–17.0)
MCH: 33.8 pg (ref 26.0–34.0)
MCHC: 33.2 g/dL (ref 30.0–36.0)
MCV: 101.7 fL — ABNORMAL HIGH (ref 80.0–100.0)
Platelets: 60 10*3/uL — ABNORMAL LOW (ref 150–400)
RBC: 2.37 MIL/uL — ABNORMAL LOW (ref 4.22–5.81)
RDW: 19.7 % — ABNORMAL HIGH (ref 11.5–15.5)
WBC: 5.3 10*3/uL (ref 4.0–10.5)
nRBC: 0 % (ref 0.0–0.2)

## 2020-06-19 LAB — PHOSPHORUS: Phosphorus: 3.4 mg/dL (ref 2.5–4.6)

## 2020-06-19 LAB — COMPREHENSIVE METABOLIC PANEL
ALT: 29 U/L (ref 0–44)
AST: 61 U/L — ABNORMAL HIGH (ref 15–41)
Albumin: 3.3 g/dL — ABNORMAL LOW (ref 3.5–5.0)
Alkaline Phosphatase: 89 U/L (ref 38–126)
Anion gap: 8 (ref 5–15)
BUN: <5 mg/dL — ABNORMAL LOW (ref 6–20)
CO2: 28 mmol/L (ref 22–32)
Calcium: 9.2 mg/dL (ref 8.9–10.3)
Chloride: 99 mmol/L (ref 98–111)
Creatinine, Ser: 0.52 mg/dL — ABNORMAL LOW (ref 0.61–1.24)
GFR, Estimated: >60 mL/min (ref >60)
Glucose, Bld: 88 mg/dL (ref 70–99)
Potassium: 3.8 mmol/L (ref 3.5–5.1)
Sodium: 135 mmol/L (ref 135–145)
Total Bilirubin: 7.7 mg/dL — ABNORMAL HIGH (ref 0.3–1.2)
Total Protein: 7.5 g/dL (ref 6.5–8.1)

## 2020-06-19 LAB — LIPASE, BLOOD: Lipase: 52 U/L — ABNORMAL HIGH (ref 11–51)

## 2020-06-19 LAB — MAGNESIUM: Magnesium: 1.5 mg/dL — ABNORMAL LOW (ref 1.7–2.4)

## 2020-06-19 MED ORDER — MAGNESIUM SULFATE 2 GM/50ML IV SOLN
2.0000 g | Freq: Once | INTRAVENOUS | Status: AC
  Administered 2020-06-19: 2 g via INTRAVENOUS
  Filled 2020-06-19: qty 50

## 2020-06-19 NOTE — Progress Notes (Addendum)
PROGRESS NOTE    Lannie FieldsRandall Dorvil  ZOX:[id number] DOB: 12-12-1985 DOA: 06/16/2020 PCP: Pcp, No   Brief Narrative:  This 35 year old male with PMH significant of chronic alcohol abuse who was sent from alcohol detox facility due to concerns for DTs.  He did not respond to Librium and Valium at detox facility. He was drinking 10 glasses of bourbon a day. He was found to have multiple bruises on his extremities and around his body. Multiple imagings did not show any acute finding. Liver imaging showed splenomegaly, portal hypertension, fatty infiltration versus diffuse hepatocellular disease.  CT abdomen showed possible mild acute pancreatitis and has mild elevated lipase.  He was found to have hemoglobin 6.0,  platelet count 16 K.  Patient was given PRBC and platelet transfusions in the ED.  Hemoglobin improved to 8.0.  Platelet improved to 60 K.  Urine drug screen positive for marijuana and benzos. Patient was started on CIWA protocol for alcohol withdrawal.  GI was consulted.  Patient is hemodynamically stable.  There is no evidence of active bleeding at this time.  Patient has been started on octreotide and Protonix drip.  GI is planning for EGD before discharge.  Assessment & Plan:   Principal Problem:   Alcohol withdrawal (HCC) Active Problems:   Encephalopathy   Thrombocytopenia (HCC)   Macrocytic anemia   Pancreatitis   Hypokalemia   Hyponatremia   Jaundice   Hyperbilirubinemia   Laceration of chin   Alcohol withdrawal/concerns for DT.: Patient presented with alcohol withdrawal symptoms, not controlled with Librium and Valium. Admitted to Progressive care unit. Continue CIWA protocol . Continue ativan as needed for elevated CIWA scores.  Continue librium.  Continue folic acid, thiamine and MVI daily. Fall precautions.   Active Problems: Thrombocytopenia:> Improving. Patient presented with platelet count of 16.0 and bruising all over the body. Patient has received  platelet transfusion in the ER. Posttransfusion platelet improved to 45K., trending up 60 K  Macrocytic anemia: This could be due to EtOH. Vitamin B12 is normal. S/p 1 PRBC. Hb improved from 6.6->7.7>8.0 No evidence of active bleeding at this time. GI was consulted.  It does not appear any acute GI problem. GI recommended it would be reasonable to consider endoscopy and colonoscopy for the evaluation of anemia once patient is medically stable. Octreotide and Protonix infusions were discontinued.  Pancreatitis He is found to have mildly elevated lipase and CT consistent with pancreatitis. He denies any abdominal pain,  continue supportive care. Lipase is trending down.  Hyperbilirubinemia :  Abdominal ultrasound showed no gallstones, portal hypertension, fatty infiltration.   Could be due to alcoholic liver disease. Continue supportive care,  total bilirubin trending down.  Hypokalemia : Improved.Marland Kitchen.  Hyponatremia > improved. IVF with LR. Monitor electrolytes.    Metabolic Encephalopathy : > Improved. Secondary to alcohol withdrawal Improving.  CT head negative for acute abnormality.    DVT prophylaxis: SCDs Code Status: Full code. Family Communication: Parents at bed side. Disposition Plan:   Status is: Inpatient  Remains inpatient appropriate because:Inpatient level of care appropriate due to severity of illness   Dispo: The patient is from: Home              Anticipated d/c is to: Home              Patient currently is not medically stable to d/c.   Difficult to place patient No  Consultants:   GI  Procedures:  Antimicrobials:   Anti-infectives (From admission, onward)   None  Subjective: Patient was seen and examined at bedside.  Overnight events noted.  Patient reports feeling much better.  He is more alert, oriented x3.  His labs are improving.   Denies any pain or further bleeding.  Objective: Vitals:   06/18/20 1402 06/18/20 1539  06/18/20 1925 06/19/20 1022  BP: 110/69 108/60 110/69 130/76  Pulse: 80 86 91 85  Resp: 18  16   Temp: (!) 97.5 F (36.4 C)  98.2 F (36.8 C)   TempSrc: Oral  Oral   SpO2: 96%  97%   Weight:      Height:        Intake/Output Summary (Last 24 hours) at 06/19/2020 1329 Last data filed at 06/19/2020 0930 Gross per 24 hour  Intake 1200 ml  Output 2950 ml  Net -1750 ml   Filed Weights   06/16/20 2050 06/17/20 1535  Weight: 85.7 kg 73.5 kg    Examination:  General exam: Appears calm and comfortable, not in any acute distress.   Respiratory system: Clear to auscultation. Respiratory effort normal. Cardiovascular system: S1 & S2 heard, RRR. No JVD, murmurs, rubs, gallops or clicks. No pedal edema. Gastrointestinal system: Abdomen is nondistended, soft and nontender. No organomegaly or masses felt. Normal bowel sounds heard. Central nervous system: Alert and oriented. No focal neurological deficits. Extremities: No edema, no cyanosis, no clubbing. Skin: No rashes, lesions or ulcers Psychiatry: Judgement and insight appear normal. Mood & affect appropriate.     Data Reviewed: I have personally reviewed following labs and imaging studies  CBC: Recent Labs  Lab 06/16/20 2150 06/17/20 0505 06/17/20 1605 06/18/20 0746 06/19/20 0326  WBC 3.8* 4.4 4.6 4.8 5.3  NEUTROABS 2.4  --   --   --   --   HGB 6.6* 6.6* 7.7* 7.7* 8.0*  HCT 19.7* 19.7* 22.6* 22.4* 24.1*  MCV 104.8* 101.5* 98.7 99.6 101.7*  PLT 16* 19* 38* 47* 60*   Basic Metabolic Panel: Recent Labs  Lab 06/16/20 2150 06/17/20 0107 06/17/20 0505 06/18/20 0746 06/19/20 0326  NA 132*  --  133* 134* 135  K 3.2*  --  3.2* 3.7 3.8  CL 98  --  97* 98 99  CO2 25  --  26 26 28   GLUCOSE 94  --  106* 125* 88  BUN 8  --  8 6 <5*  CREATININE 0.55*  --  0.71 0.69 0.52*  CALCIUM 9.0  --  9.6 9.2 9.2  MG  --  1.2*  --   --  1.5*  PHOS  --   --   --   --  3.4   GFR: Estimated Creatinine Clearance: 128.3 mL/min (A) (by C-G  formula based on SCr of 0.52 mg/dL (L)). Liver Function Tests: Recent Labs  Lab 06/16/20 2150 06/17/20 0505 06/18/20 0746 06/19/20 0326  AST 107* 109* 73* 61*  ALT 34 37 31 29  ALKPHOS 94 98 94 89  BILITOT 8.7* 9.0* 8.6* 7.7*  PROT 8.2* 8.4* 7.7 7.5  ALBUMIN 3.6 3.8 3.4* 3.3*   Recent Labs  Lab 06/16/20 2150 06/19/20 0326  LIPASE 67* 52*   No results for input(s): AMMONIA in the last 168 hours. Coagulation Profile: Recent Labs  Lab 06/16/20 2150 06/18/20 0746  INR 1.9* 1.8*   Cardiac Enzymes: No results for input(s): CKTOTAL, CKMB, CKMBINDEX, TROPONINI in the last 168 hours. BNP (last 3 results) No results for input(s): PROBNP in the last 8760 hours. HbA1C: No results for input(s): HGBA1C in the last  72 hours. CBG: Recent Labs  Lab 06/17/20 2005  GLUCAP 218*   Lipid Profile: No results for input(s): CHOL, HDL, LDLCALC, TRIG, CHOLHDL, LDLDIRECT in the last 72 hours. Thyroid Function Tests: Recent Labs    06/17/20 0224  TSH 1.276   Anemia Panel: Recent Labs    06/17/20 0030 06/17/20 0224  VITAMINB12 1,248*  --   FOLATE  --  7.6   Sepsis Labs: No results for input(s): PROCALCITON, LATICACIDVEN in the last 168 hours.  Recent Results (from the past 240 hour(s))  Resp Panel by RT-PCR (Flu A&B, Covid) Nasopharyngeal Swab     Status: None   Collection Time: 06/16/20  9:43 PM   Specimen: Nasopharyngeal Swab; Nasopharyngeal(NP) swabs in vial transport medium  Result Value Ref Range Status   SARS Coronavirus 2 by RT PCR NEGATIVE NEGATIVE Final    Comment: (NOTE) SARS-CoV-2 target nucleic acids are NOT DETECTED.  The SARS-CoV-2 RNA is generally detectable in upper respiratory specimens during the acute phase of infection. The lowest concentration of SARS-CoV-2 viral copies this assay can detect is 138 copies/mL. A negative result does not preclude SARS-Cov-2 infection and should not be used as the sole basis for treatment or other patient management  decisions. A negative result may occur with  improper specimen collection/handling, submission of specimen other than nasopharyngeal swab, presence of viral mutation(s) within the areas targeted by this assay, and inadequate number of viral copies(<138 copies/mL). A negative result must be combined with clinical observations, patient history, and epidemiological information. The expected result is Negative.  Fact Sheet for Patients:  BloggerCourse.com  Fact Sheet for Healthcare Providers:  SeriousBroker.it  This test is no t yet approved or cleared by the [place] FDA and  has been authorized for detection and/or diagnosis of SARS-CoV-2 by FDA under an Emergency Use Authorization (EUA). This EUA will remain  in effect (meaning this test can be used) for the duration of the COVID-19 declaration under Section 564(b)(1) of the Act, 21 U.S.C.section 360bbb-3(b)(1), unless the authorization is terminated  or revoked sooner.       Influenza A by PCR NEGATIVE NEGATIVE Final   Influenza B by PCR NEGATIVE NEGATIVE Final    Comment: (NOTE) The Xpert Xpress SARS-CoV-2/FLU/RSV plus assay is intended as an aid in the diagnosis of influenza from Nasopharyngeal swab specimens and should not be used as a sole basis for treatment. Nasal washings and aspirates are unacceptable for Xpert Xpress SARS-CoV-2/FLU/RSV testing.  Fact Sheet for Patients: BloggerCourse.com  Fact Sheet for Healthcare Providers: SeriousBroker.it  This test is not yet approved or cleared by the [place] FDA and has been authorized for detection and/or diagnosis of SARS-CoV-2 by FDA under an Emergency Use Authorization (EUA). This EUA will remain in effect (meaning this test can be used) for the duration of the COVID-19 declaration under Section 564(b)(1) of the Act, 21 U.S.C. section 360bbb-3(b)(1), unless the  authorization is terminated or revoked.  Performed at [facility], [address], [place], [place] [address]          Radiology Studies: CT HEAD WO CONTRAST  Result Date: 06/18/2020 CLINICAL DATA:  Fall, facial trauma, facial laceration, altered mental status EXAM: CT HEAD WITHOUT CONTRAST CT MAXILLOFACIAL WITHOUT CONTRAST TECHNIQUE: Multidetector CT imaging of the head and maxillofacial structures were performed using the standard protocol without intravenous contrast. Multiplanar CT image reconstructions of the maxillofacial structures were also generated. COMPARISON:  None. FINDINGS: CT HEAD FINDINGS Brain: There is mild diffuse cerebral  atrophy, greater than would be expected for patient age. No abnormal intra or extra-axial mass lesion or fluid collection. No abnormal mass effect or midline shift. No evidence of acute intracranial hemorrhage or infarct. Ventricular size is normal. Cerebellum unremarkable. Vascular: Unremarkable Skull: Intact Sinuses/Orbits: Paranasal sinuses are clear. Orbits are unremarkable. Other: There is fluid opacification of the right mastoid air cells and middle ear cavity. Left mastoid air cells and middle ear cavity are clear. CT MAXILLOFACIAL FINDINGS Osseous: No fracture or mandibular dislocation. No destructive process. Orbits: Negative. No traumatic or inflammatory finding. Sinuses: Clear. Soft tissues: There is mild soft tissue swelling and subcutaneous gas seen superficial to the mandibular mentum. IMPRESSION: No acute intracranial injury.  No calvarial fracture. Mild diffuse cerebral atrophy, greater than would be expected for patient age. No acute facial fracture. Mild soft tissue swelling superficial to the mandibular mentum. Electronically Signed   By: Helyn Numbers MD   On: 06/18/2020 03:51   CT MAXILLOFACIAL WO CONTRAST  Result Date: 06/18/2020 CLINICAL DATA:  Fall, facial trauma, facial laceration, altered mental status EXAM: CT  HEAD WITHOUT CONTRAST CT MAXILLOFACIAL WITHOUT CONTRAST TECHNIQUE: Multidetector CT imaging of the head and maxillofacial structures were performed using the standard protocol without intravenous contrast. Multiplanar CT image reconstructions of the maxillofacial structures were also generated. COMPARISON:  None. FINDINGS: CT HEAD FINDINGS Brain: There is mild diffuse cerebral atrophy, greater than would be expected for patient age. No abnormal intra or extra-axial mass lesion or fluid collection. No abnormal mass effect or midline shift. No evidence of acute intracranial hemorrhage or infarct. Ventricular size is normal. Cerebellum unremarkable. Vascular: Unremarkable Skull: Intact Sinuses/Orbits: Paranasal sinuses are clear. Orbits are unremarkable. Other: There is fluid opacification of the right mastoid air cells and middle ear cavity. Left mastoid air cells and middle ear cavity are clear. CT MAXILLOFACIAL FINDINGS Osseous: No fracture or mandibular dislocation. No destructive process. Orbits: Negative. No traumatic or inflammatory finding. Sinuses: Clear. Soft tissues: There is mild soft tissue swelling and subcutaneous gas seen superficial to the mandibular mentum. IMPRESSION: No acute intracranial injury.  No calvarial fracture. Mild diffuse cerebral atrophy, greater than would be expected for patient age. No acute facial fracture. Mild soft tissue swelling superficial to the mandibular mentum. Electronically Signed   By: Helyn Numbers MD   On: 06/18/2020 03:51   Scheduled Meds: . chlordiazePOXIDE  25 mg Oral TID  . folic acid  1 mg Oral Daily  . LORazepam  0-4 mg Intravenous Q12H   Or  . LORazepam  0-4 mg Oral Q12H  . LORazepam  6 mg Intravenous Once  . multivitamin with minerals  1 tablet Oral Daily  . nicotine  14 mg Transdermal Daily  . [START ON 06/20/2020] pantoprazole  40 mg Intravenous Q12H  . sertraline  50 mg Oral Daily  . thiamine  100 mg Oral Daily   Or  . thiamine  100 mg  Intravenous Daily  . traZODone  50 mg Oral QHS   Continuous Infusions: . sodium chloride    . lactated ringers 100 mL/hr at 06/17/20 1909     LOS: 2 days    Time spent: 25 mins    Samel Bruna, MD [facility]   If 7PM-7AM, please contact night-coverage

## 2020-06-19 NOTE — Plan of Care (Signed)

## 2020-06-20 LAB — CBC
HCT: 25.3 % — ABNORMAL LOW (ref 39.0–52.0)
Hemoglobin: 8.5 g/dL — ABNORMAL LOW (ref 13.0–17.0)
MCH: 35.1 pg — ABNORMAL HIGH (ref 26.0–34.0)
MCHC: 33.6 g/dL (ref 30.0–36.0)
MCV: 104.5 fL — ABNORMAL HIGH (ref 80.0–100.0)
Platelets: 72 10*3/uL — ABNORMAL LOW (ref 150–400)
RBC: 2.42 MIL/uL — ABNORMAL LOW (ref 4.22–5.81)
RDW: 19.5 % — ABNORMAL HIGH (ref 11.5–15.5)
WBC: 5.8 10*3/uL (ref 4.0–10.5)
nRBC: 0 % (ref 0.0–0.2)

## 2020-06-20 LAB — COMPREHENSIVE METABOLIC PANEL WITH GFR
ALT: 26 U/L (ref 0–44)
AST: 53 U/L — ABNORMAL HIGH (ref 15–41)
Albumin: 3.4 g/dL — ABNORMAL LOW (ref 3.5–5.0)
Alkaline Phosphatase: 98 U/L (ref 38–126)
Anion gap: 10 (ref 5–15)
BUN: 5 mg/dL — ABNORMAL LOW (ref 6–20)
CO2: 25 mmol/L (ref 22–32)
Calcium: 9.5 mg/dL (ref 8.9–10.3)
Chloride: 99 mmol/L (ref 98–111)
Creatinine, Ser: 0.61 mg/dL (ref 0.61–1.24)
GFR, Estimated: >60 mL/min
Glucose, Bld: 91 mg/dL (ref 70–99)
Potassium: 3.8 mmol/L (ref 3.5–5.1)
Sodium: 134 mmol/L — ABNORMAL LOW (ref 135–145)
Total Bilirubin: 8.4 mg/dL — ABNORMAL HIGH (ref 0.3–1.2)
Total Protein: 8.2 g/dL — ABNORMAL HIGH (ref 6.5–8.1)

## 2020-06-20 LAB — MAGNESIUM: Magnesium: 1.7 mg/dL (ref 1.7–2.4)

## 2020-06-20 MED ORDER — LORAZEPAM 2 MG/ML IJ SOLN
2.0000 mg | Freq: Once | INTRAMUSCULAR | Status: AC
  Administered 2020-06-20: 2 mg via INTRAVENOUS
  Filled 2020-06-20: qty 1

## 2020-06-20 NOTE — Progress Notes (Signed)
Spoke with patient's Mother, Steve Rattler, and she is coming to spend the night with patient to help monitor for safety. Pt has had a safety sitter most of shift and continues to need constant monitoring due to impulsive behavior and in and out confusion. Charge aware and approved family to stay overnight.Melton Alar, RN

## 2020-06-20 NOTE — Progress Notes (Signed)
Pt has been agitated and somewhat belligerent with safety sitter this shift wanting to get OOB by himself to go to bathroom. Have given him multiple reminders of his unsteadiness on his feet and to not get up by himself. He states he is "sick of all these rules". Sleeping at frequent intervals. Melton Alar, RN

## 2020-06-20 NOTE — Progress Notes (Signed)
PROGRESS NOTE    Nathan Osborne  UMP:[id number] DOB: [birth year] DOA: 06/16/2020 PCP: Pcp, No   Brief Narrative:  This 35 year old male with PMH significant of chronic alcohol abuse who was sent from alcohol detox facility due to concerns for DTs.  He did not respond to Librium and Valium at detox facility. He was drinking 10 glasses of bourbon a day. He was found to have multiple bruises on his extremities and around his body. Multiple imagings did not show any acute finding. Liver imaging showed splenomegaly, portal hypertension, fatty infiltration versus diffuse hepatocellular disease.  CT abdomen showed possible mild acute pancreatitis and has mild elevated lipase.  He was found to have hemoglobin 6.0,  platelet count 16 K.  Patient was given PRBC and platelet transfusions in the ED.  Hemoglobin improved to 8.5.  Platelet improved to 72 K.  Urine drug screen positive for marijuana and benzos. Patient was started on CIWA protocol for alcohol withdrawal.  GI was consulted.  Patient is hemodynamically stable.  There is no evidence of active bleeding at this time. octreotide and Protonix drip has been discontinued.  GI was initially planning for EGD before discharge but then decided no indication at this time.  Assessment & Plan:   Principal Problem:   Alcohol withdrawal (HCC) Active Problems:   Encephalopathy   Thrombocytopenia (HCC)   Macrocytic anemia   Pancreatitis   Hypokalemia   Hyponatremia   Jaundice   Hyperbilirubinemia   Laceration of chin   Alcohol withdrawal/concerns for DT.: Patient presented with alcohol withdrawal symptoms, not controlled with Librium and Valium. Admitted to Progressive care unit. Continue CIWA protocol . Continue ativan as needed for elevated CIWA scores.  Continue librium.  Continue folic acid, thiamine and MVI daily. Fall precautions.   Active Problems: Thrombocytopenia:> Improving. Patient presented with platelet count of 16.0 and bruising  all over the body. Patient has received platelet transfusion in the ER. Posttransfusion platelet improved to 45K., trending up 60 K > 72 K  Macrocytic anemia: This could be due to EtOH. Vitamin B12 is normal. S/p 1 PRBC. Hb improved from 6.6->7.7>8.0> 8.5 No evidence of active bleeding at this time. GI was consulted.  It does not appear any acute GI problem. GI recommended it would be reasonable to consider endoscopy and colonoscopy for the evaluation of anemia once patient is medically stable. Octreotide and Protonix infusions were discontinued. This could be sec. to pancytopenia as result of bone marrow suppression from alcohol. Outpatient GI follow-up has been scheduled.  Pancreatitis He has mildly elevated lipase and CT consistent with pancreatitis. He denies any abdominal pain,  continue supportive care. Lipase is trending down.  Hyperbilirubinemia :  Abdominal ultrasound showed no gallstones, portal hypertension, fatty infiltration.   Could be due to alcoholic liver disease. Continue supportive care,  total bilirubin trending down.  Hypokalemia : Improved.Marland Kitchen  Hyponatremia > improved. IVF with LR. Monitor electrolytes.   Metabolic Encephalopathy : > Improved. Secondary to alcohol withdrawal Improving.  CT head negative for acute abnormality.   DVT prophylaxis: SCDs Code Status: Full code. Family Communication: Parents at bed side. Disposition Plan:   Status is: Inpatient  Remains inpatient appropriate because:Inpatient level of care appropriate due to severity of illness   Dispo: The patient is from: Home              Anticipated d/c is to: Home on 5/28              Patient currently is not medically stable  to d/c.   Difficult to place patient No  Consultants:   GI  Procedures:  Antimicrobials:   Anti-infectives (From admission, onward)   None      Subjective: Patient was seen and examined at bedside.  Overnight events noted.  Patient reports  feeling much better.  His oral intake has improved but he still feels weak, his labs are improving.   Denies any pain or further bleeding.  He is scared to go home.  Objective: Vitals:   06/19/20 1627 06/19/20 2046 06/20/20 0402 06/20/20 1359  BP: 116/74 129/83 (!) 142/70 105/63  Pulse: 91 89 84 87  Resp:  20 20 16   Temp: 98.1 F (36.7 C) 98 F (36.7 C) 98.5 F (36.9 C) 97.9 F (36.6 C)  TempSrc: Oral Oral Oral   SpO2: 96% 97% 97% 96%  Weight:      Height:        Intake/Output Summary (Last 24 hours) at 06/20/2020 1408 Last data filed at 06/20/2020 1300 Gross per 24 hour  Intake 1155.89 ml  Output 3125 ml  Net -1969.11 ml   Filed Weights   06/16/20 2050 06/17/20 1535  Weight: 85.7 kg 73.5 kg    Examination:  General exam: Appears calm and comfortable, not in any acute distress.   Respiratory system: Clear to auscultation. Respiratory effort normal. Cardiovascular system: S1 & S2 heard, RRR. No JVD, murmurs, rubs, gallops or clicks. No pedal edema. Gastrointestinal system: Abdomen is nondistended, soft and nontender. No organomegaly or masses felt. Normal bowel sounds heard. Central nervous system: Alert and oriented x 3 . No focal neurological deficits. Extremities: No edema, no cyanosis, no clubbing. Skin: No rashes, lesions or ulcers Psychiatry: Judgement and insight appear normal. Mood & affect appropriate.     Data Reviewed: I have personally reviewed following labs and imaging studies  CBC: Recent Labs  Lab 06/16/20 2150 06/17/20 0505 06/17/20 1605 06/18/20 0746 06/19/20 0326 06/20/20 0337  WBC 3.8* 4.4 4.6 4.8 5.3 5.8  NEUTROABS 2.4  --   --   --   --   --   HGB 6.6* 6.6* 7.7* 7.7* 8.0* 8.5*  HCT 19.7* 19.7* 22.6* 22.4* 24.1* 25.3*  MCV 104.8* 101.5* 98.7 99.6 101.7* 104.5*  PLT 16* 19* 38* 47* 60* 72*   Basic Metabolic Panel: Recent Labs  Lab 06/16/20 2150 06/17/20 0107 06/17/20 0505 06/18/20 0746 06/19/20 0326 06/20/20 0337  NA 132*  --   133* 134* 135 134*  K 3.2*  --  3.2* 3.7 3.8 3.8  CL 98  --  97* 98 99 99  CO2 25  --  26 26 28 25   GLUCOSE 94  --  106* 125* 88 91  BUN 8  --  8 6 <5* 5*  CREATININE 0.55*  --  0.71 0.69 0.52* 0.61  CALCIUM 9.0  --  9.6 9.2 9.2 9.5  MG  --  1.2*  --   --  1.5* 1.7  PHOS  --   --   --   --  3.4  --    GFR: Estimated Creatinine Clearance: 128.3 mL/min (by C-G formula based on SCr of 0.61 mg/dL). Liver Function Tests: Recent Labs  Lab 06/16/20 2150 06/17/20 0505 06/18/20 0746 06/19/20 0326 06/20/20 0337  AST 107* 109* 73* 61* 53*  ALT 34 37 31 29 26   ALKPHOS 94 98 94 89 98  BILITOT 8.7* 9.0* 8.6* 7.7* 8.4*  PROT 8.2* 8.4* 7.7 7.5 8.2*  ALBUMIN 3.6 3.8 3.4* 3.3*  3.4*   Recent Labs  Lab 06/16/20 2150 06/19/20 0326  LIPASE 67* 52*   No results for input(s): AMMONIA in the last 168 hours. Coagulation Profile: Recent Labs  Lab 06/16/20 2150 06/18/20 0746  INR 1.9* 1.8*   Cardiac Enzymes: No results for input(s): CKTOTAL, CKMB, CKMBINDEX, TROPONINI in the last 168 hours. BNP (last 3 results) No results for input(s): PROBNP in the last 8760 hours. HbA1C: No results for input(s): HGBA1C in the last 72 hours. CBG: Recent Labs  Lab 06/17/20 2005  GLUCAP 218*   Lipid Profile: No results for input(s): CHOL, HDL, LDLCALC, TRIG, CHOLHDL, LDLDIRECT in the last 72 hours. Thyroid Function Tests: No results for input(s): TSH, T4TOTAL, FREET4, T3FREE, THYROIDAB in the last 72 hours. Anemia Panel: No results for input(s): VITAMINB12, FOLATE, FERRITIN, TIBC, IRON, RETICCTPCT in the last 72 hours. Sepsis Labs: No results for input(s): PROCALCITON, LATICACIDVEN in the last 168 hours.  Recent Results (from the past 240 hour(s))  Resp Panel by RT-PCR (Flu A&B, Covid) Nasopharyngeal Swab     Status: None   Collection Time: 06/16/20  9:43 PM   Specimen: Nasopharyngeal Swab; Nasopharyngeal(NP) swabs in vial transport medium  Result Value Ref Range Status   SARS Coronavirus 2 by  RT PCR NEGATIVE NEGATIVE Final    Comment: (NOTE) SARS-CoV-2 target nucleic acids are NOT DETECTED.  The SARS-CoV-2 RNA is generally detectable in upper respiratory specimens during the acute phase of infection. The lowest concentration of SARS-CoV-2 viral copies this assay can detect is 138 copies/mL. A negative result does not preclude SARS-Cov-2 infection and should not be used as the sole basis for treatment or other patient management decisions. A negative result may occur with  improper specimen collection/handling, submission of specimen other than nasopharyngeal swab, presence of viral mutation(s) within the areas targeted by this assay, and inadequate number of viral copies(<138 copies/mL). A negative result must be combined with clinical observations, patient history, and epidemiological information. The expected result is Negative.  Fact Sheet for Patients:  BloggerCourse.comhttps://www.fda.gov/media/152166/download  Fact Sheet for Healthcare Providers:  SeriousBroker.ithttps://www.fda.gov/media/152162/download  This test is no t yet approved or cleared by the [place] FDA and  has been authorized for detection and/or diagnosis of SARS-CoV-2 by FDA under an Emergency Use Authorization (EUA). This EUA will remain  in effect (meaning this test can be used) for the duration of the COVID-19 declaration under Section 564(b)(1) of the Act, 21 U.S.C.section 360bbb-3(b)(1), unless the authorization is terminated  or revoked sooner.       Influenza A by PCR NEGATIVE NEGATIVE Final   Influenza B by PCR NEGATIVE NEGATIVE Final    Comment: (NOTE) The Xpert Xpress SARS-CoV-2/FLU/RSV plus assay is intended as an aid in the diagnosis of influenza from Nasopharyngeal swab specimens and should not be used as a sole basis for treatment. Nasal washings and aspirates are unacceptable for Xpert Xpress SARS-CoV-2/FLU/RSV testing.  Fact Sheet for Patients: BloggerCourse.comhttps://www.fda.gov/media/152166/download  Fact Sheet  for Healthcare Providers: SeriousBroker.ithttps://www.fda.gov/media/152162/download  This test is not yet approved or cleared by the [place] FDA and has been authorized for detection and/or diagnosis of SARS-CoV-2 by FDA under an Emergency Use Authorization (EUA). This EUA will remain in effect (meaning this test can be used) for the duration of the COVID-19 declaration under Section 564(b)(1) of the Act, 21 U.S.C. section 360bbb-3(b)(1), unless the authorization is terminated or revoked.  Performed at [facility], [address] [place], [place] [address]     Radiology Studies: No  results found. Scheduled Meds: . chlordiazePOXIDE  25 mg Oral TID  . folic acid  1 mg Oral Daily  . LORazepam  0-4 mg Intravenous Q12H   Or  . LORazepam  0-4 mg Oral Q12H  . LORazepam  6 mg Intravenous Once  . multivitamin with minerals  1 tablet Oral Daily  . nicotine  14 mg Transdermal Daily  . pantoprazole  40 mg Intravenous Q12H  . sertraline  50 mg Oral Daily  . thiamine  100 mg Oral Daily   Or  . thiamine  100 mg Intravenous Daily  . traZODone  50 mg Oral QHS   Continuous Infusions: . sodium chloride    . lactated ringers 100 mL/hr at 06/20/20 1206     LOS: 3 days    Time spent: 25 mins    Adilenne Ashworth, MD [facility]   If 7PM-7AM, please contact night-coverage

## 2020-06-20 NOTE — Evaluation (Addendum)
Physical Therapy Evaluation Patient Details Name: Nathan Osborne MRN: [id number] DOB: [birth year] Today's Date: 06/20/2020   History of Present Illness  This 35 year old male with PMH significant of chronic alcohol abuse who was sent from alcohol detox facility due to concerns for DTs.  Clinical Impression  Pt admitted with above diagnosis. Pt with decr coordination, UE/LE tremors however able to amb ~60' with RW and min assist. Will continue to follow in acute setting. Pt currently with functional limitations due to the deficits listed below (see PT Problem List). Pt will benefit from skilled PT to increase their independence and safety with mobility to allow discharge to the venue listed below.       Follow Up Recommendations Other (comment);Supervision/Assistance - 24 hour (return to substance rehab vs home with 24 hr supervision) vs SNF if progress slow    Equipment Recommendations  Rolling walker with 5" wheels    Recommendations for Other Services       Precautions / Restrictions Precautions Precautions: Fall Restrictions Weight Bearing Restrictions: No      Mobility  Bed Mobility Overal bed mobility: Needs Assistance Bed Mobility: Supine to Sit     Supine to sit: Min guard Sit to supine: Min guard   General bed mobility comments: for safety    Transfers Overall transfer level: Needs assistance Equipment used: Rolling walker (2 wheeled) Transfers: Sit to/from Stand Sit to Stand: Min assist Stand pivot transfers: Min assist       General transfer comment: tremulous UEs/LEs,  cues for hand placement and LE position, assist to rise and transition to RW  Ambulation/Gait Ambulation/Gait assistance: Min assist Gait Distance (Feet): 60 Feet Assistive device: Rolling walker (2 wheeled) Gait Pattern/deviations: Shuffle;Decreased step length - left;Decreased step length - right;Trunk flexed;Drifts right/left     General Gait Details: cues for trunk and hip  extension, to incr step length bil  Stairs            Wheelchair Mobility    Modified Rankin (Stroke Patients Only)       Balance Overall balance assessment: Needs assistance Sitting-balance support: No upper extremity supported;Feet supported Sitting balance-Leahy Scale: Fair Sitting balance - Comments: attempts to put shoes on, able towt shift however unable to complete without assist   Standing balance support: During functional activity;Bilateral upper extremity supported Standing balance-Leahy Scale: Poor Standing balance comment: reliant on walker                             Pertinent Vitals/Pain Pain Assessment: No/denies pain    Home Living Family/patient expects to be discharged to:: Private residence Living Arrangements: Alone Available Help at Discharge: Family;Available PRN/intermittently Type of Home: House Home Access: Stairs to enter   Entrance Stairs-Number of Steps: 2 Home Layout: Multi-level Home Equipment: Gilmer Mor - single point Additional Comments: family lives very close. office is upstairs    Prior Function Level of Independence: Independent               Hand Dominance   Dominant Hand: Left    Extremity/Trunk Assessment   Upper Extremity Assessment Upper Extremity Assessment: Defer to OT evaluation RUE Deficits / Details: WFL ROM 5/5 strength RUE Sensation: WNL RUE Coordination: decreased fine motor;decreased gross motor (secondary to tremors) LUE Deficits / Details: WFL ROM, 5/5 strength LUE Sensation: WNL LUE Coordination: decreased fine motor;decreased gross motor (from tremors)    Lower Extremity Assessment Lower Extremity Assessment: Generalized weakness;RLE deficits/detail;LLE deficits/detail RLE Coordination:  decreased gross motor LLE Coordination: decreased gross motor    Cervical / Trunk Assessment Cervical / Trunk Assessment: Normal  Communication   Communication: No difficulties  Cognition  Arousal/Alertness: Awake/alert Behavior During Therapy: WFL for tasks assessed/performed Overall Cognitive Status: Impaired/Different from baseline Area of Impairment: Orientation;Memory;Awareness;Safety/judgement                 Orientation Level: Disoriented to;Place   Memory: Decreased short-term memory   Safety/Judgement: Decreased awareness of safety;Decreased awareness of deficits Awareness: Emergent Problem Solving: Slow processing;Requires verbal cues;Difficulty sequencing        General Comments      Exercises     Assessment/Plan    PT Assessment Patient needs continued PT services  PT Problem List Decreased strength;Decreased range of motion;Decreased activity tolerance;Decreased balance;Decreased mobility;Decreased knowledge of use of DME;Pain       PT Treatment Interventions DME instruction;Therapeutic activities;Gait training;Functional mobility training;Therapeutic exercise;Patient/family education;Stair training    PT Goals (Current goals can be found in the Care Plan section)  Acute Rehab PT Goals Patient Stated Goal: to get better PT Goal Formulation: With patient Time For Goal Achievement: 07/03/20 Potential to Achieve Goals: Good    Frequency Min 3X/week   Barriers to discharge        Co-evaluation               AM-PAC PT "6 Clicks" Mobility  Outcome Measure Help needed turning from your back to your side while in a flat bed without using bedrails?: A Little Help needed moving from lying on your back to sitting on the side of a flat bed without using bedrails?: A Little Help needed moving to and from a bed to a chair (including a wheelchair)?: A Little Help needed standing up from a chair using your arms (e.g., wheelchair or bedside chair)?: A Little Help needed to walk in hospital room?: A Little Help needed climbing 3-5 steps with a railing? : A Little 6 Click Score: 18    End of Session Equipment Utilized During Treatment:  Gait belt Activity Tolerance: Patient tolerated treatment well Patient left: in bed;with bed alarm set;with call bell/phone within reach;with nursing/sitter in room Nurse Communication: Mobility status PT Visit Diagnosis: Other abnormalities of gait and mobility (R26.89);Muscle weakness (generalized) (M62.81)    Time: 2707-8675 PT Time Calculation (min) (ACUTE ONLY): 13 min   Charges:   PT Evaluation $PT Eval Low Complexity: 1 Low          Kyngston Pickelsimer, PT  Acute Rehab Dept ([facility]) [phone] Pager [phone]  06/20/2020   [facility] 06/20/2020, 3:21 PM

## 2020-06-20 NOTE — Plan of Care (Signed)

## 2020-06-20 NOTE — TOC Progression Note (Signed)
Transition of Care [facility]) - Progression Note    Patient Details  Name: Terris Germano MRN: [id number] Date of Birth: Feb 03, 1985  [facility] CM/SW Contact  Darleene Cleaver, [place] Phone Number: 06/20/2020, 4:36 PM  Clinical Narrative:     CSW spoke to Madelaine Bhat nurse at [facility], he said, he spoke to the nurse this morning, and patient was not interested in returning.  Patient would rather return back home with some home health.  CSW attempted to contact patient, however he did not answer phone, CSW will try to follow up at a later time.     Expected Discharge Plan and Services                                                 Social Determinants of Health (SDOH) Interventions    Readmission Risk Interventions No flowsheet data found.

## 2020-06-20 NOTE — Evaluation (Signed)
Occupational Therapy Evaluation Patient Details Name: Nathan Osborne MRN: [id number] DOB: 05/16/1985 Today's Date: 06/20/2020    History of Present Illness This 35 year old male with PMH significant of chronic alcohol abuse who was sent from alcohol detox facility due to concerns for DTs.   Clinical Impression   Mr. Linus Weckerly is a 35 year old man who presents with impaired cognition, balance, activity tolerance and upper and lower tremors resulting in inability to independently take care of himself. Overall patient needs min assist for steadying and RW for ambulation and increased assistance for LB ADLs secondary to balance deficits and tremors. Patient is too unsafe to ambulate or perform ADLs without assistance at this time. Recommend continued therapy until he improves independence and should be able to return home with 24/7 assistance or return to alcohol detox facility. SNF if facility accepting also an option.    Follow Up Recommendations  Supervision/Assistance - 24 hour;Home health OT    Equipment Recommendations  Tub/shower seat    Recommendations for Other Services       Precautions / Restrictions Precautions Precautions: Fall Restrictions Weight Bearing Restrictions: No      Mobility Bed Mobility Overal bed mobility: Needs Assistance Bed Mobility: Supine to Sit;Sit to Supine     Supine to sit: Min guard Sit to supine: Min guard        Transfers Overall transfer level: Needs assistance Equipment used: Rolling walker (2 wheeled) Transfers: Sit to/from [employer] Sit to Stand: Min assist Stand pivot transfers: Min assist       General transfer comment: Tremors in upper and lower extremity - impaired balance needing external assistance of RW. Unable to rise from seated position without use of hands. Fearful in standing due tremulous lower extremities. ABle to take steps to head of bed with RW with verbal cues to bear down through  walker. slow processing and slow to follow commands.    Balance Overall balance assessment: Needs assistance Sitting-balance support: No upper extremity supported;Feet supported Sitting balance-Leahy Scale: Fair     Standing balance support: During functional activity;Bilateral upper extremity supported Standing balance-Leahy Scale: Poor Standing balance comment: reliant on walker                           ADL either performed or assessed with clinical judgement   ADL Overall ADL's : Needs assistance/impaired Eating/Feeding: Set up Eating/Feeding Details (indicate cue type and reason): increased time and with difficulty due to tremors Grooming: Set up   Upper Body Bathing: Set up;Sitting   Lower Body Bathing: Minimal assistance;Sit to/from stand   Upper Body Dressing : Minimal assistance;Set up   Lower Body Dressing: Minimal assistance;Sit to/from stand Lower Body Dressing Details (indicate cue type and reason): able to perform tasks with some difficulty due to tremors in seated position - needs assistance for pulling clothing up due to impaired balance Toilet Transfer: Minimal assistance;RW;Stand-pivot;BSC   Toileting- Clothing Manipulation and Hygiene: Sit to/from stand;Moderate assistance Toileting - Clothing Manipulation Details (indicate cue type and reason): for clothing management             Vision Patient Visual Report: No change from baseline       Perception     Praxis      Pertinent Vitals/Pain Pain Assessment: No/denies pain     Hand Dominance Left   Extremity/Trunk Assessment Upper Extremity Assessment Upper Extremity Assessment: RUE deficits/detail;LUE deficits/detail RUE Deficits / Details: WFL ROM 5/5 strength  RUE Sensation: WNL RUE Coordination: decreased fine motor;decreased gross motor (secondary to tremors) LUE Deficits / Details: WFL ROM, 5/5 strength LUE Sensation: WNL LUE Coordination: decreased fine motor;decreased gross  motor (from tremors)   Lower Extremity Assessment Lower Extremity Assessment: Defer to PT evaluation   Cervical / Trunk Assessment Cervical / Trunk Assessment: Normal   Communication Communication Communication: No difficulties   Cognition Arousal/Alertness: Awake/alert Behavior During Therapy: WFL for tasks assessed/performed Overall Cognitive Status: Impaired/Different from baseline Area of Impairment: Orientation;Memory;Awareness;Safety/judgement                 Orientation Level: Disoriented to;Place   Memory: Decreased short-term memory   Safety/Judgement: Decreased awareness of safety;Decreased awareness of deficits Awareness: Emergent Problem Solving: Slow processing     General Comments       Exercises     Shoulder Instructions      Home Living Family/patient expects to be discharged to:: Private residence Living Arrangements: Alone Available Help at Discharge: Family;Available PRN/intermittently Type of Home: House Home Access: Stairs to enter Entrance Stairs-Number of Steps: 2   Home Layout: Multi-level Alternate Level Stairs-Number of Steps: flight   Bathroom Shower/Tub: Producer, television/film/video: Standard     Home Equipment: Cane - single point   Additional Comments: reports he has someone to get into the shower. family lives very close.      Prior Functioning/Environment Level of Independence: Independent                 OT Problem List: Decreased activity tolerance;Impaired balance (sitting and/or standing);Decreased safety awareness;Decreased cognition;Impaired UE functional use;Decreased knowledge of use of DME or AE      OT Treatment/Interventions: Self-care/ADL training;DME and/or AE instruction;Balance training;Patient/family education;Therapeutic activities    OT Goals(Current goals can be found in the care plan section) Acute Rehab OT Goals Patient Stated Goal: to get better OT Goal Formulation: With  patient Time For Goal Achievement: 07/04/20 Potential to Achieve Goals: Good  OT Frequency: Min 2X/week   Barriers to D/C:            Co-evaluation              AM-PAC OT "6 Clicks" Daily Activity     Outcome Measure Help from another person eating meals?: A Little Help from another person taking care of personal grooming?: A Little Help from another person toileting, which includes using toliet, bedpan, or urinal?: A Lot Help from another person bathing (including washing, rinsing, drying)?: A Little Help from another person to put on and taking off regular upper body clothing?: A Little Help from another person to put on and taking off regular lower body clothing?: A Lot 6 Click Score: 16   End of Session Equipment Utilized During Treatment: Gait belt;Rolling walker Nurse Communication: Mobility status  Activity Tolerance: Patient tolerated treatment well Patient left: in bed;with call bell/phone within reach;with bed alarm set;with nursing/sitter in room  OT Visit Diagnosis: Unsteadiness on feet (R26.81)                Time: 4431-5400 OT Time Calculation (min): 16 min Charges:  OT General Charges $OT Visit: 1 Visit OT Evaluation $OT Eval Low Complexity: 1 Low  Rella Egelston, OTR/L Acute Care Rehab Services  Office [phone] Pager: [phone]   Kelli Churn 06/20/2020, 2:37 PM

## 2020-06-20 NOTE — Progress Notes (Signed)
Patient's labs are showing the typical evolution of someone with early alcohol abstinence: A gradual decline in AST, but persistent elevation (or even slight rise) in total bilirubin.  Hematologically, the patient has had progressive rise in hemoglobin without transfusion, and a corresponding rise in his platelet count.  Both of these are consistent with his presenting pancytopenia having been the result of bone marrow suppression from alcohol.  I do not feel that evaluation for anemia is necessary while the patient is in the hospital.  I have made the patient a follow-up appointment with our physician assistant, Michel Santee, for June 28 at 4 PM.  I have given the patient my card with this appointment time written on it.  Depending on his Hemoccult status at that time and the evolution of his hemoglobin level, it may or may not be felt that endoscopic and/or colonoscopic evaluation should be arranged.  Importance of alcohol avoidance again emphasized.  Florencia Reasons, M.D. Pager [phone] If no answer or after 5 PM call [phone]

## 2020-06-21 LAB — COMPREHENSIVE METABOLIC PANEL
ALT: 24 U/L (ref 0–44)
AST: 42 U/L — ABNORMAL HIGH (ref 15–41)
Albumin: 3.5 g/dL (ref 3.5–5.0)
Alkaline Phosphatase: 102 U/L (ref 38–126)
Anion gap: 7 (ref 5–15)
BUN: 7 mg/dL (ref 6–20)
CO2: 24 mmol/L (ref 22–32)
Calcium: 9.2 mg/dL (ref 8.9–10.3)
Chloride: 102 mmol/L (ref 98–111)
Creatinine, Ser: 0.66 mg/dL (ref 0.61–1.24)
GFR, Estimated: >60 mL/min (ref >60)
Glucose, Bld: 104 mg/dL — ABNORMAL HIGH (ref 70–99)
Potassium: 3.4 mmol/L — ABNORMAL LOW (ref 3.5–5.1)
Sodium: 133 mmol/L — ABNORMAL LOW (ref 135–145)
Total Bilirubin: 8.9 mg/dL — ABNORMAL HIGH (ref 0.3–1.2)
Total Protein: 8.1 g/dL (ref 6.5–8.1)

## 2020-06-21 LAB — HEPATIC FUNCTION PANEL
ALT: 24 U/L (ref 0–44)
AST: 47 U/L — ABNORMAL HIGH (ref 15–41)
Albumin: 3.6 g/dL (ref 3.5–5.0)
Alkaline Phosphatase: 105 U/L (ref 38–126)
Bilirubin, Direct: 1.6 mg/dL — ABNORMAL HIGH (ref 0.0–0.2)
Indirect Bilirubin: 6.7 mg/dL — ABNORMAL HIGH (ref 0.3–0.9)
Total Bilirubin: 8.3 mg/dL — ABNORMAL HIGH (ref 0.3–1.2)
Total Protein: 8.6 g/dL — ABNORMAL HIGH (ref 6.5–8.1)

## 2020-06-21 LAB — CBC
HCT: 26 % — ABNORMAL LOW (ref 39.0–52.0)
HCT: 24.8 % — ABNORMAL LOW (ref 39.0–52.0)
Hemoglobin: 8.7 g/dL — ABNORMAL LOW (ref 13.0–17.0)
Hemoglobin: 8.3 g/dL — ABNORMAL LOW (ref 13.0–17.0)
MCH: 34.1 pg — ABNORMAL HIGH (ref 26.0–34.0)
MCH: 34.3 pg — ABNORMAL HIGH (ref 26.0–34.0)
MCHC: 33.5 g/dL (ref 30.0–36.0)
MCHC: 33.5 g/dL (ref 30.0–36.0)
MCV: 102 fL — ABNORMAL HIGH (ref 80.0–100.0)
MCV: 102.5 fL — ABNORMAL HIGH (ref 80.0–100.0)
Platelets: 84 10*3/uL — ABNORMAL LOW (ref 150–400)
Platelets: 88 10*3/uL — ABNORMAL LOW (ref 150–400)
RBC: 2.55 MIL/uL — ABNORMAL LOW (ref 4.22–5.81)
RBC: 2.42 MIL/uL — ABNORMAL LOW (ref 4.22–5.81)
RDW: 18.8 % — ABNORMAL HIGH (ref 11.5–15.5)
RDW: 18.8 % — ABNORMAL HIGH (ref 11.5–15.5)
WBC: 5.8 10*3/uL (ref 4.0–10.5)
WBC: 5 10*3/uL (ref 4.0–10.5)
nRBC: 0 % (ref 0.0–0.2)
nRBC: 0 % (ref 0.0–0.2)

## 2020-06-21 LAB — PHOSPHORUS: Phosphorus: 3 mg/dL (ref 2.5–4.6)

## 2020-06-21 LAB — MAGNESIUM: Magnesium: 1.7 mg/dL (ref 1.7–2.4)

## 2020-06-21 MED ORDER — THIAMINE HCL 100 MG PO TABS: 100.0000 mg | ORAL_TABLET | Freq: Every day | ORAL | Status: DC

## 2020-06-21 MED ORDER — LORAZEPAM 1 MG PO TABS
1.0000 mg | ORAL_TABLET | ORAL | Status: AC | PRN
  Administered 2020-06-21: 1 mg via ORAL
  Filled 2020-06-21 (×2): qty 1

## 2020-06-21 MED ORDER — ADULT MULTIVITAMIN W/MINERALS CH: 1.0000 | ORAL_TABLET | Freq: Every day | ORAL | Status: DC

## 2020-06-21 MED ORDER — FOLIC ACID 1 MG PO TABS: 1.0000 mg | ORAL_TABLET | Freq: Every day | ORAL | Status: DC

## 2020-06-21 MED ORDER — THIAMINE HCL 100 MG/ML IJ SOLN: 100.0000 mg | Freq: Every day | INTRAMUSCULAR | Status: DC

## 2020-06-21 MED ORDER — LORAZEPAM 2 MG/ML IJ SOLN
1.0000 mg | INTRAMUSCULAR | Status: AC | PRN
  Administered 2020-06-21: 2 mg via INTRAVENOUS
  Filled 2020-06-21: qty 1

## 2020-06-21 NOTE — Progress Notes (Signed)
Spoke with Minerva Areola, RN from treatment center. Gave update on pt's status.  Val [place]

## 2020-06-21 NOTE — Progress Notes (Signed)
PROGRESS NOTE    Nathan Osborne  MWN:[id number] DOB: December 06, 1985 DOA: 06/16/2020 PCP: Pcp, No   Brief Narrative:  This 35 year old male with PMH significant of chronic alcohol abuse who was sent from alcohol detox facility due to concerns for DTs.  He did not respond to Librium and Valium at detox facility. He was drinking 10 glasses of bourbon a day. He was found to have multiple bruises on his extremities and around his body. Multiple imagings did not show any acute finding. Liver imaging showed splenomegaly, portal hypertension, fatty infiltration versus diffuse hepatocellular disease.  CT abdomen showed possible mild acute pancreatitis and has mild elevated lipase.  He was found to have hemoglobin 6.0,  platelet count 16 K.  Patient was given PRBC and platelet transfusions in the ED.  Hemoglobin improved to 8.5.  Platelet improved to 88 K.  Urine drug screen positive for marijuana and benzos. Patient was started on CIWA protocol for alcohol withdrawal.  GI was consulted.  Patient is hemodynamically stable.  There is no evidence of active bleeding at this time. octreotide and Protonix drip has been discontinued.  GI was initially planning for EGD before discharge but then decided no indication at this time.  Assessment & Plan:   Principal Problem:   Alcohol withdrawal (HCC) Active Problems:   Encephalopathy   Thrombocytopenia (HCC)   Macrocytic anemia   Pancreatitis   Hypokalemia   Hyponatremia   Jaundice   Hyperbilirubinemia   Laceration of chin   Alcohol withdrawal / concerns for DT.: Patient presented with alcohol withdrawal symptoms, not controlled with Librium and Valium. Admitted to Progressive care unit. Continue CIWA protocol . Continue ativan as needed for elevated CIWA scores.  Continue librium.  Continue folic acid, thiamine and MVI daily. Fall precautions.   Active Problems: Thrombocytopenia:> Improving. Patient presented with platelet count of 16.0 and  bruising all over the body. Patient has received platelet transfusion in the ER. Posttransfusion platelet improved to 45K., trending up 60 K > 72 K >88  Macrocytic anemia: This could be due to EtOH. Vitamin B12 is normal. S/p 1 PRBC. Hb improved from 6.6->7.7>8.0> 8.5>8.3 No evidence of active bleeding at this time. GI was consulted.  It does not appear any acute GI problem. GI recommended it would be reasonable to consider endoscopy and colonoscopy for the evaluation of anemia once patient is medically stable. Octreotide and Protonix infusions were discontinued. This could be sec. to pancytopenia as result of bone marrow suppression from alcohol. Outpatient GI follow-up has been scheduled.  Pancreatitis He has mildly elevated lipase and CT consistent with pancreatitis. He denies any abdominal pain,  continue supportive care. Lipase is trending down.  Hyperbilirubinemia :  Abdominal ultrasound showed no gallstones, portal hypertension, fatty infiltration.   Could be due to alcoholic liver disease. Continue supportive care,  total bilirubin trending down.  Hypokalemia : Improved.Continue to [employer].  Hyponatremia > improved. Monitor electrolytes.   Metabolic Encephalopathy : > Improved. Secondary to alcohol withdrawal Improving.  CT head negative for acute abnormality.   DVT prophylaxis: SCDs Code Status: Full code. Family Communication: Parents at bed side. Disposition Plan:   Status is: Inpatient  Remains inpatient appropriate because:Inpatient level of care appropriate due to severity of illness   Dispo: The patient is from: Home              Anticipated d/c is to: Home on 5/29              Patient currently is not  medically stable to d/c.   Difficult to place patient No  Consultants:   GI  Procedures:  Antimicrobials:   Anti-infectives (From admission, onward)   None      Subjective: Patient was seen and examined at bedside.  Overnight events  noted.  Patient was agitated and belligerent last night, trying to get out of bed by self.   Patient has fallen 2 days ago.  Patient was reported to be rude to the staff in the morning. Patient's been more sleepy in the morning, mother and aunt is in the room.  Objective: Vitals:   06/20/20 1359 06/20/20 2210 06/21/20 0620 06/21/20 1000  BP: 105/63 127/72 115/76 120/73  Pulse: 87 87 89 89  Resp: 16 18 18    Temp: 97.9 F (36.6 C) 98.1 F (36.7 C) 98.8 F (37.1 C)   TempSrc:  Oral Oral   SpO2: 96% 97% 95%   Weight:      Height:        Intake/Output Summary (Last 24 hours) at 06/21/2020 1103 Last data filed at 06/21/2020 0600 Gross per 24 hour  Intake 1204.23 ml  Output 1400 ml  Net -195.77 ml   Filed Weights   06/16/20 2050 06/17/20 1535  Weight: 85.7 kg 73.5 kg    Examination:  General exam: Appears calm and comfortable, not in any acute distress.   Respiratory system: Clear to auscultation. Respiratory effort normal. Cardiovascular system: S1 & S2 heard, RRR. No JVD, murmurs, rubs, gallops or clicks. No pedal edema. Gastrointestinal system: Abdomen is nondistended, soft and nontender. No organomegaly or masses felt. Normal bowel sounds heard. Central nervous system: Alert and oriented x 3 . No focal neurological deficits. Extremities: Multiple bruising noted.  Big bruise noted on the right elbow. Skin: No rashes, lesions or ulcers Psychiatry: Judgement and insight appear normal. Mood & affect appropriate.     Data Reviewed: I have personally reviewed following labs and imaging studies  CBC: Recent Labs  Lab 06/16/20 2150 06/17/20 0505 06/18/20 0746 06/19/20 0326 06/20/20 0337 06/21/20 0137 06/21/20 0905  WBC 3.8*   < > 4.8 5.3 5.8 5.8 5.0  NEUTROABS 2.4  --   --   --   --   --   --   HGB 6.6*   < > 7.7* 8.0* 8.5* 8.7* 8.3*  HCT 19.7*   < > 22.4* 24.1* 25.3* 26.0* 24.8*  MCV 104.8*   < > 99.6 101.7* 104.5* 102.0* 102.5*  PLT 16*   < > 47* 60* 72* 84* 88*    < > = values in this interval not displayed.   Basic Metabolic Panel: Recent Labs  Lab 06/17/20 0107 06/17/20 0505 06/18/20 0746 06/19/20 0326 06/20/20 0337 06/21/20 0905  NA  --  133* 134* 135 134* 133*  K  --  3.2* 3.7 3.8 3.8 3.4*  CL  --  97* 98 99 99 102  CO2  --  26 26 28 25 24   GLUCOSE  --  106* 125* 88 91 104*  BUN  --  8 6 <5* 5* 7  CREATININE  --  0.71 0.69 0.52* 0.61 0.66  CALCIUM  --  9.6 9.2 9.2 9.5 9.2  MG 1.2*  --   --  1.5* 1.7 1.7  PHOS  --   --   --  3.4  --  3.0   GFR: Estimated Creatinine Clearance: 128.3 mL/min (by C-G formula based on SCr of 0.66 mg/dL). Liver Function Tests: Recent Labs  Lab 06/18/20  9563 06/19/20 0326 06/20/20 0337 06/21/20 0137 06/21/20 0905  AST 73* 61* 53* 47* 42*  ALT 31 29 26 24 24   ALKPHOS 94 89 98 105 102  BILITOT 8.6* 7.7* 8.4* 8.3* 8.9*  PROT 7.7 7.5 8.2* 8.6* 8.1  ALBUMIN 3.4* 3.3* 3.4* 3.6 3.5   Recent Labs  Lab 06/16/20 2150 06/19/20 0326  LIPASE 67* 52*   No results for input(s): AMMONIA in the last 168 hours. Coagulation Profile: Recent Labs  Lab 06/16/20 2150 06/18/20 0746  INR 1.9* 1.8*   Cardiac Enzymes: No results for input(s): CKTOTAL, CKMB, CKMBINDEX, TROPONINI in the last 168 hours. BNP (last 3 results) No results for input(s): PROBNP in the last 8760 hours. HbA1C: No results for input(s): HGBA1C in the last 72 hours. CBG: Recent Labs  Lab 06/17/20 2005  GLUCAP 218*   Lipid Profile: No results for input(s): CHOL, HDL, LDLCALC, TRIG, CHOLHDL, LDLDIRECT in the last 72 hours. Thyroid Function Tests: No results for input(s): TSH, T4TOTAL, FREET4, T3FREE, THYROIDAB in the last 72 hours. Anemia Panel: No results for input(s): VITAMINB12, FOLATE, FERRITIN, TIBC, IRON, RETICCTPCT in the last 72 hours. Sepsis Labs: No results for input(s): PROCALCITON, LATICACIDVEN in the last 168 hours.  Recent Results (from the past 240 hour(s))  Resp Panel by RT-PCR (Flu A&B, Covid) Nasopharyngeal Swab      Status: None   Collection Time: 06/16/20  9:43 PM   Specimen: Nasopharyngeal Swab; Nasopharyngeal(NP) swabs in vial transport medium  Result Value Ref Range Status   SARS Coronavirus 2 by RT PCR NEGATIVE NEGATIVE Final    Comment: (NOTE) SARS-CoV-2 target nucleic acids are NOT DETECTED.  The SARS-CoV-2 RNA is generally detectable in upper respiratory specimens during the acute phase of infection. The lowest concentration of SARS-CoV-2 viral copies this assay can detect is 138 copies/mL. A negative result does not preclude SARS-Cov-2 infection and should not be used as the sole basis for treatment or other patient management decisions. A negative result may occur with  improper specimen collection/handling, submission of specimen other than nasopharyngeal swab, presence of viral mutation(s) within the areas targeted by this assay, and inadequate number of viral copies(<138 copies/mL). A negative result must be combined with clinical observations, patient history, and epidemiological information. The expected result is Negative.  Fact Sheet for Patients:  06/18/20  Fact Sheet for Healthcare Providers:  BloggerCourse.com  This test is no t yet approved or cleared by the [employer] FDA and  has been authorized for detection and/or diagnosis of SARS-CoV-2 by FDA under an Emergency Use Authorization (EUA). This EUA will remain  in effect (meaning this test can be used) for the duration of the COVID-19 declaration under Section 564(b)(1) of the Act, 21 U.S.C.section 360bbb-3(b)(1), unless the authorization is terminated  or revoked sooner.       Influenza A by PCR NEGATIVE NEGATIVE Final   Influenza B by PCR NEGATIVE NEGATIVE Final    Comment: (NOTE) The Xpert Xpress SARS-CoV-2/FLU/RSV plus assay is intended as an aid in the diagnosis of influenza from Nasopharyngeal swab specimens and should not be used as a sole basis  for treatment. Nasal washings and aspirates are unacceptable for Xpert Xpress SARS-CoV-2/FLU/RSV testing.  Fact Sheet for Patients: [place]  Fact Sheet for Healthcare Providers: BloggerCourse.com  This test is not yet approved or cleared by the [employer] FDA and has been authorized for detection and/or diagnosis of SARS-CoV-2 by FDA under an Emergency Use Authorization (EUA). This EUA will remain in effect (meaning  this test can be used) for the duration of the COVID-19 declaration under Section 564(b)(1) of the Act, 21 U.S.C. section 360bbb-3(b)(1), unless the authorization is terminated or revoked.  Performed at [facility], [address] [place], [place] [address]     Radiology Studies: No results found. Scheduled Meds: . chlordiazePOXIDE  25 mg Oral TID  . folic acid  1 mg Oral Daily  . LORazepam  6 mg Intravenous Once  . multivitamin with minerals  1 tablet Oral Daily  . nicotine  14 mg Transdermal Daily  . pantoprazole  40 mg Intravenous Q12H  . sertraline  50 mg Oral Daily  . thiamine  100 mg Oral Daily   Or  . thiamine  100 mg Intravenous Daily  . traZODone  50 mg Oral QHS   Continuous Infusions: . sodium chloride    . lactated ringers 100 mL/hr at 06/21/20 0949     LOS: 4 days    Time spent: 25 mins    Amore Grater, MD [facility]   If 7PM-7AM, please contact night-coverage

## 2020-06-21 NOTE — Progress Notes (Signed)
Bed "on-line". Bed  connections are secure and bed plugged into red socket.

## 2020-06-22 LAB — CBC
HCT: 25.6 % — ABNORMAL LOW (ref 39.0–52.0)
Hemoglobin: 8.5 g/dL — ABNORMAL LOW (ref 13.0–17.0)
MCH: 33.6 pg (ref 26.0–34.0)
MCHC: 33.2 g/dL (ref 30.0–36.0)
MCV: 101.2 fL — ABNORMAL HIGH (ref 80.0–100.0)
Platelets: 102 10*3/uL — ABNORMAL LOW (ref 150–400)
RBC: 2.53 MIL/uL — ABNORMAL LOW (ref 4.22–5.81)
RDW: 18.4 % — ABNORMAL HIGH (ref 11.5–15.5)
WBC: 4.5 10*3/uL (ref 4.0–10.5)
nRBC: 0 % (ref 0.0–0.2)

## 2020-06-22 LAB — PHOSPHORUS: Phosphorus: 3.9 mg/dL (ref 2.5–4.6)

## 2020-06-22 LAB — COMPREHENSIVE METABOLIC PANEL
ALT: 22 U/L (ref 0–44)
AST: 40 U/L (ref 15–41)
Albumin: 3.6 g/dL (ref 3.5–5.0)
Alkaline Phosphatase: 103 U/L (ref 38–126)
Anion gap: 7 (ref 5–15)
BUN: 7 mg/dL (ref 6–20)
CO2: 25 mmol/L (ref 22–32)
Calcium: 9.4 mg/dL (ref 8.9–10.3)
Chloride: 103 mmol/L (ref 98–111)
Creatinine, Ser: 0.61 mg/dL (ref 0.61–1.24)
GFR, Estimated: >60 mL/min (ref >60)
Glucose, Bld: 89 mg/dL (ref 70–99)
Potassium: 3.5 mmol/L (ref 3.5–5.1)
Sodium: 135 mmol/L (ref 135–145)
Total Bilirubin: 8.4 mg/dL — ABNORMAL HIGH (ref 0.3–1.2)
Total Protein: 8.1 g/dL (ref 6.5–8.1)

## 2020-06-22 LAB — MAGNESIUM: Magnesium: 1.7 mg/dL (ref 1.7–2.4)

## 2020-06-22 MED ORDER — PANTOPRAZOLE SODIUM 40 MG PO TBEC
40.0000 mg | DELAYED_RELEASE_TABLET | Freq: Two times a day (BID) | ORAL | Status: DC
  Administered 2020-06-22 – 2020-06-25 (×6): 40 mg via ORAL
  Filled 2020-06-22 (×6): qty 1

## 2020-06-22 NOTE — Progress Notes (Signed)
PROGRESS NOTE    Vijay Durflinger  QJJ:[id number] DOB: Jan 10, 1986 DOA: 06/16/2020 PCP: Pcp, No   Brief Narrative:  This 35 year old male with PMH significant of chronic alcohol abuse who was sent from alcohol detox facility due to concerns for DTs.  He did not respond to Librium and Valium at detox facility. He was drinking 10 glasses of bourbon a day. He was found to have multiple bruises on his extremities and around his body. Multiple imagings did not show any acute finding. Liver imaging showed splenomegaly, portal hypertension, fatty infiltration versus diffuse hepatocellular disease.  CT abdomen showed possible mild acute pancreatitis and has mild elevated lipase.  He was found to have hemoglobin 6.0,  platelet count 16 K.  Patient was given PRBC and platelet transfusions in the ED.  Hemoglobin improved to 8.5.  Platelet improved to 88 K.  Urine drug screen positive for marijuana and benzos. Patient was started on CIWA protocol for alcohol withdrawal.  GI was consulted.  Patient is hemodynamically stable.  There is no evidence of active bleeding at this time. octreotide and Protonix drip has been discontinued.  GI was initially planning for EGD before discharge but then decided no indication at this time.  Assessment & Plan:   Principal Problem:   Alcohol withdrawal (HCC) Active Problems:   Encephalopathy   Thrombocytopenia (HCC)   Macrocytic anemia   Pancreatitis   Hypokalemia   Hyponatremia   Jaundice   Hyperbilirubinemia   Laceration of chin   Alcohol withdrawal / concerns for DT.: Patient presented with alcohol withdrawal symptoms, not controlled with Librium and Valium. Admitted to Progressive care unit. Continue CIWA protocol . Continue ativan as needed for elevated CIWA scores.  Continue librium.  Continue folic acid, thiamine and MVI daily. Fall precautions.   Active Problems: Thrombocytopenia:> Improving. Patient presented with platelet count of 16.0 and  bruising all over the body. Patient has received platelet transfusion in the ER. Posttransfusion platelet improved to 45K., trending up 60 K > 72 K >88>102  Macrocytic anemia: This could be due to EtOH. Vitamin B12 is normal. S/p 1 PRBC. Hb improved from 6.6->7.7>8.0> 8.5>8.3>8.5 No evidence of active bleeding at this time. GI was consulted.  It does not appear any acute GI problem. GI recommended it would be reasonable to consider endoscopy and colonoscopy for the evaluation of anemia once patient is medically stable. Octreotide and Protonix infusions were discontinued. This could be sec. to pancytopenia as result of bone marrow suppression from alcohol. Outpatient GI follow-up has been scheduled.  Pancreatitis He has mildly elevated lipase and CT consistent with pancreatitis. He denies any abdominal pain,  continue supportive care. Lipase is trending down.  Hyperbilirubinemia :  Abdominal ultrasound showed no gallstones, portal hypertension, fatty infiltration.   Could be due to alcoholic liver disease. Continue supportive care,  total bilirubin remains stable.  Hypokalemia : Improved.Continue to [place] [employer].  Hyponatremia > improved. Monitor electrolytes.   Metabolic Encephalopathy : > Improved. Secondary to alcohol withdrawal Improving.  CT head negative for acute abnormality.   DVT prophylaxis: SCDs Code Status: Full code. Family Communication: Parents at bed side. Disposition Plan:   Status is: Inpatient  Remains inpatient appropriate because:Inpatient level of care appropriate due to severity of illness   Dispo: The patient is from: Home              Anticipated d/c is to: Detox facility in 1-2 days.              Patient currently  is not medically stable to d/c.   Difficult to place patient No  Consultants:   GI  Procedures:  Antimicrobials:   Anti-infectives (From admission, onward)   None      Subjective: Patient was seen and examined at  bedside.  Overnight events noted.  Patient is more alert and awake,  looks very improved. He denies any abdominal pain or any bleeding. He wants to be discharged back to detox facility to complete the rehab.  Mother was at bedside.  Objective: Vitals:   06/21/20 1000 06/21/20 1431 06/21/20 2016 06/22/20 0555  BP: 120/73 132/61 107/66 (!) 102/55  Pulse: 89 86 85 92  Resp:  20 17 16   Temp:  98.4 F (36.9 C) 98.3 F (36.8 C) 99.6 F (37.6 C)  TempSrc:  Oral Oral Oral  SpO2:  91% 97% 95%  Weight:      Height:        Intake/Output Summary (Last 24 hours) at 06/22/2020 1103 Last data filed at 06/22/2020 0303 Gross per 24 hour  Intake 1876.37 ml  Output --  Net 1876.37 ml   Filed Weights   06/16/20 2050 06/17/20 1535  Weight: 85.7 kg 73.5 kg    Examination:  General exam: Appears calm and comfortable, not in any acute distress.   Respiratory system: Clear to auscultation. Respiratory effort normal. Cardiovascular system: S1 & S2 heard, RRR. No JVD, murmurs, rubs, gallops or clicks. No pedal edema. Gastrointestinal system: Abdomen is nondistended, soft and nontender. No organomegaly or masses felt.  Normal bowel sounds heard. Central nervous system: Alert and oriented x 3 . No focal neurological deficits. Extremities: Multiple bruising noted.  Big bruise noted on the right elbow. Skin: No rashes, lesions or ulcers Psychiatry: Judgement and insight appear normal. Mood & affect appropriate.     Data Reviewed: I have personally reviewed following labs and imaging studies  CBC: Recent Labs  Lab 06/16/20 2150 06/17/20 0505 06/19/20 0326 06/20/20 0337 06/21/20 0137 06/21/20 0905 06/22/20 0336  WBC 3.8*   < > 5.3 5.8 5.8 5.0 4.5  NEUTROABS 2.4  --   --   --   --   --   --   HGB 6.6*   < > 8.0* 8.5* 8.7* 8.3* 8.5*  HCT 19.7*   < > 24.1* 25.3* 26.0* 24.8* 25.6*  MCV 104.8*   < > 101.7* 104.5* 102.0* 102.5* 101.2*  PLT 16*   < > 60* 72* 84* 88* 102*   < > = values in this  interval not displayed.   Basic Metabolic Panel: Recent Labs  Lab 06/17/20 0107 06/17/20 0505 06/18/20 0746 06/19/20 0326 06/20/20 0337 06/21/20 0905 06/22/20 0336  NA  --    < > 134* 135 134* 133* 135  K  --    < > 3.7 3.8 3.8 3.4* 3.5  CL  --    < > 98 99 99 102 103  CO2  --    < > 26 28 25 24 25   GLUCOSE  --    < > 125* 88 91 104* 89  BUN  --    < > 6 <5* 5* 7 7  CREATININE  --    < > 0.69 0.52* 0.61 0.66 0.61  CALCIUM  --    < > 9.2 9.2 9.5 9.2 9.4  MG 1.2*  --   --  1.5* 1.7 1.7 1.7  PHOS  --   --   --  3.4  --  3.0 3.9   < > =  values in this interval not displayed.   GFR: Estimated Creatinine Clearance: 128.3 mL/min (by C-G formula based on SCr of 0.61 mg/dL). Liver Function Tests: Recent Labs  Lab 06/19/20 0326 06/20/20 0337 06/21/20 0137 06/21/20 0905 06/22/20 0336  AST 61* 53* 47* 42* 40  ALT 29 26 24 24 22   ALKPHOS 89 98 105 102 103  BILITOT 7.7* 8.4* 8.3* 8.9* 8.4*  PROT 7.5 8.2* 8.6* 8.1 8.1  ALBUMIN 3.3* 3.4* 3.6 3.5 3.6   Recent Labs  Lab 06/16/20 2150 06/19/20 0326  LIPASE 67* 52*   No results for input(s): AMMONIA in the last 168 hours. Coagulation Profile: Recent Labs  Lab 06/16/20 2150 06/18/20 0746  INR 1.9* 1.8*   Cardiac Enzymes: No results for input(s): CKTOTAL, CKMB, CKMBINDEX, TROPONINI in the last 168 hours. BNP (last 3 results) No results for input(s): PROBNP in the last 8760 hours. HbA1C: No results for input(s): HGBA1C in the last 72 hours. CBG: Recent Labs  Lab 06/17/20 2005  GLUCAP 218*   Lipid Profile: No results for input(s): CHOL, HDL, LDLCALC, TRIG, CHOLHDL, LDLDIRECT in the last 72 hours. Thyroid Function Tests: No results for input(s): TSH, T4TOTAL, FREET4, T3FREE, THYROIDAB in the last 72 hours. Anemia Panel: No results for input(s): VITAMINB12, FOLATE, FERRITIN, TIBC, IRON, RETICCTPCT in the last 72 hours. Sepsis Labs: No results for input(s): PROCALCITON, LATICACIDVEN in the last 168 hours.  Recent  Results (from the past 240 hour(s))  Resp Panel by RT-PCR (Flu A&B, Covid) Nasopharyngeal Swab     Status: None   Collection Time: 06/16/20  9:43 PM   Specimen: Nasopharyngeal Swab; Nasopharyngeal(NP) swabs in vial transport medium  Result Value Ref Range Status   SARS Coronavirus 2 by RT PCR NEGATIVE NEGATIVE Final    Comment: (NOTE) SARS-CoV-2 target nucleic acids are NOT DETECTED.  The SARS-CoV-2 RNA is generally detectable in upper respiratory specimens during the acute phase of infection. The lowest concentration of SARS-CoV-2 viral copies this assay can detect is 138 copies/mL. A negative result does not preclude SARS-Cov-2 infection and should not be used as the sole basis for treatment or other patient management decisions. A negative result may occur with  improper specimen collection/handling, submission of specimen other than nasopharyngeal swab, presence of viral mutation(s) within the areas targeted by this assay, and inadequate number of viral copies(<138 copies/mL). A negative result must be combined with clinical observations, patient history, and epidemiological information. The expected result is Negative.  Fact Sheet for Patients:  06/18/20  Fact Sheet for Healthcare Providers:  BloggerCourse.com  This test is no t yet approved or cleared by the [employer] FDA and  has been authorized for detection and/or diagnosis of SARS-CoV-2 by FDA under an Emergency Use Authorization (EUA). This EUA will remain  in effect (meaning this test can be used) for the duration of the COVID-19 declaration under Section 564(b)(1) of the Act, 21 U.S.C.section 360bbb-3(b)(1), unless the authorization is terminated  or revoked sooner.       Influenza A by PCR NEGATIVE NEGATIVE Final   Influenza B by PCR NEGATIVE NEGATIVE Final    Comment: (NOTE) The Xpert Xpress SARS-CoV-2/FLU/RSV plus assay is intended as an aid in the  diagnosis of influenza from Nasopharyngeal swab specimens and should not be used as a sole basis for treatment. Nasal washings and aspirates are unacceptable for Xpert Xpress SARS-CoV-2/FLU/RSV testing.  Fact Sheet for Patients: [place]  Fact Sheet for Healthcare Providers: BloggerCourse.com  This test is not yet approved or  cleared by the [place] and has been authorized for detection and/or diagnosis of SARS-CoV-2 by FDA under an Emergency Use Authorization (EUA). This EUA will remain in effect (meaning this test can be used) for the duration of the COVID-19 declaration under Section 564(b)(1) of the Act, 21 U.S.C. section 360bbb-3(b)(1), unless the authorization is terminated or revoked.  Performed at [facility], [address], [place], [place] [address]     Radiology Studies: No results found. Scheduled Meds: . chlordiazePOXIDE  25 mg Oral TID  . folic acid  1 mg Oral Daily  . LORazepam  6 mg Intravenous Once  . multivitamin with minerals  1 tablet Oral Daily  . nicotine  14 mg Transdermal Daily  . pantoprazole  40 mg Intravenous Q12H  . sertraline  50 mg Oral Daily  . thiamine  100 mg Oral Daily   Or  . thiamine  100 mg Intravenous Daily  . traZODone  50 mg Oral QHS   Continuous Infusions: . sodium chloride    . lactated ringers 100 mL/hr at 06/21/20 1957     LOS: 5 days    Time spent: 25 mins    Jove Beyl, MD [facility]   If 7PM-7AM, please contact night-coverage

## 2020-06-22 NOTE — Progress Notes (Signed)
The patient is receiving Protonix by the intravenous route.  Based on criteria approved by the Pharmacy and Therapeutics Committee and the Medical Executive Committee, the medication is being converted to the equivalent oral dose form.  These criteria include: -No active GI bleeding -Able to tolerate diet of full liquids (or better) or tube feeding -Able to tolerate other medications by the oral or enteral route  If you have any questions about this conversion, please contact the Pharmacy Department (phone [phone]).  Thank you.  Otho Bellows PharmD 06/22/2020, 1:00 PM

## 2020-06-22 NOTE — TOC Progression Note (Signed)
Transition of Care [facility]) - Progression Note    Patient Details  Name: Linkon Siverson MRN: [id number] Date of Birth: 07/07/1985  [facility]) CM/SW Contact  Marina Goodell Phone Number: [phone] 06/22/2020, 10:35 AM  Clinical Narrative:     CSW received confirmation from Attending stating the patient would like to return to [facility] [phone] treatment.  CSW called and left voicemail for admissions dept. Requesting a return call to assist with the patient's return.       Expected Discharge Plan and Services                                                 Social Determinants of Health (SDOH) Interventions    Readmission Risk Interventions No flowsheet data found.

## 2020-06-22 NOTE — TOC Progression Note (Signed)
Transition of Care [facility]) - Progression Note    Patient Details  Name: Nathan Osborne MRN: [id number] Date of Birth: 1985/08/26  Transition of Care [facility]) CM/SW Contact  Kayle Correa, Meriam Sprague, RN Phone Number: 06/22/2020, 1:58 PM  Clinical Narrative:     Spoke with Minerva Areola (RN from [facility]) who was checking to see if pt could return today. All clinicals faxed to Minerva Areola per his request. MD states that pt will be ready tomorrow after pt has seen PT. PT order placed. TOC will continue to follow.

## 2020-06-23 LAB — CBC
HCT: 25 % — ABNORMAL LOW (ref 39.0–52.0)
Hemoglobin: 8.3 g/dL — ABNORMAL LOW (ref 13.0–17.0)
MCH: 34 pg (ref 26.0–34.0)
MCHC: 33.2 g/dL (ref 30.0–36.0)
MCV: 102.5 fL — ABNORMAL HIGH (ref 80.0–100.0)
Platelets: 128 10*3/uL — ABNORMAL LOW (ref 150–400)
RBC: 2.44 MIL/uL — ABNORMAL LOW (ref 4.22–5.81)
RDW: 18.1 % — ABNORMAL HIGH (ref 11.5–15.5)
WBC: 4.9 10*3/uL (ref 4.0–10.5)
nRBC: 0 % (ref 0.0–0.2)

## 2020-06-23 NOTE — Progress Notes (Signed)
Occupational Therapy Treatment Patient Details Name: Nathan Osborne MRN: [id number] DOB: [birth year] Today's Date: 06/23/2020    History of present illness 35 year old male with PMH significant of chronic alcohol abuse who was sent from alcohol detox facility due to concerns for DTs.   OT comments  Patient demonstrates ability to perform toileting with min guard to supervision and lower body dressing with min guard with standing. Patient able to ambulate twice with min guard and RW with min assist for turns to sit in recliner. Verbal cues required for walker management. Patient making excellent progress - overall min guard/supervision for BADLs. Continues to need close supervision and intermittent steadying assist with ambulation secondary to tremulous lower extremities. Patient will continue to benefit from acute care therapy as well as frequent ambulation with nursing to progress his independence in order to discharge to substance abuse rehab facility.    Follow Up Recommendations  Supervision/Assistance - 24 hour    Equipment Recommendations  Tub/shower seat    Recommendations for Other Services      Precautions / Restrictions Precautions Precautions: Fall Precaution Comments: monitor HR Restrictions Weight Bearing Restrictions: No       Mobility Bed Mobility                    Transfers Overall transfer level: Needs assistance Equipment used: Rolling walker (2 wheeled) Transfers: Sit to/from Stand Sit to Stand: Min guard Stand pivot transfers: Min assist       General transfer comment: Min guard for all standing and ambulation for safety, min assist for turns. Patient able to ambulate in hall 50 feet with RW and then 75 feet with RW.    Balance Overall balance assessment: Needs assistance Sitting-balance support: No upper extremity supported;Feet supported Sitting balance-Leahy Scale: Fair     Standing balance support: Bilateral upper extremity  supported   Standing balance comment: able to take hands off  briefly for clothing management                           ADL either performed or assessed with clinical judgement   ADL                       Lower Body Dressing: Min guard;Sit to/from stand Lower Body Dressing Details (indicate cue type and reason): able to doff socks and don shoes, min guard for standing. Toilet Transfer: Designer, multimedia and Hygiene: Supervision/safety;Sit to/from stand               Vision Patient Visual Report: No change from baseline     Perception     Praxis      Cognition Arousal/Alertness: Awake/alert Behavior During Therapy: WFL for tasks assessed/performed Overall Cognitive Status: Within Functional Limits for tasks assessed                                          Exercises     Shoulder Instructions       General Comments      Pertinent Vitals/ Pain       Pain Assessment: No/denies pain  Home Living  Prior Functioning/Environment              Frequency  Min 2X/week        Progress Toward Goals  OT Goals(current goals can now be found in the care plan section)  Progress towards OT goals: Progressing toward goals  Acute Rehab OT Goals Patient Stated Goal: to get better OT Goal Formulation: With patient Time For Goal Achievement: 07/04/20 Potential to Achieve Goals: Good  Plan Discharge plan remains appropriate    Co-evaluation                 AM-PAC OT "6 Clicks" Daily Activity     Outcome Measure   Help from another person eating meals?: None Help from another person taking care of personal grooming?: A Little Help from another person toileting, which includes using toliet, bedpan, or urinal?: A Little Help from another person bathing (including washing, rinsing, drying)?: A Little Help from another person to put  on and taking off regular upper body clothing?: A Little Help from another person to put on and taking off regular lower body clothing?: A Little 6 Click Score: 19    End of Session Equipment Utilized During Treatment: Gait belt;Rolling walker  OT Visit Diagnosis: Unsteadiness on feet (R26.81)   Activity Tolerance Patient tolerated treatment well   Patient Left in chair;with family/visitor present   Nurse Communication Mobility status        Time: 1425-1445 OT Time Calculation (min): 20 min  Charges: OT General Charges $OT Visit: 1 Visit OT Treatments $Self Care/Home Management : 8-22 mins  Waldron Session, OTR/L Acute Care Rehab Services  Office [phone] Pager: [phone]    Kelli Churn 06/23/2020, 3:47 PM

## 2020-06-23 NOTE — Discharge Summary (Addendum)
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 lipase levels. 6. Question nonspecific gallbladder wall thickening. 7. Splenomegaly. 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 9. Small hiatal hernia. 10. Anemia. Electronically Signed   By: Tish Frederickson M.D.   On: 06/17/2020 00:10   CT MAXILLOFACIAL WO CONTRAST  Result Date: 06/18/2020 CLINICAL DATA:  Fall, facial trauma, facial laceration, altered mental status EXAM: CT HEAD WITHOUT CONTRAST CT MAXILLOFACIAL WITHOUT CONTRAST TECHNIQUE: Multidetector CT imaging of the head and maxillofacial structures were performed using the standard protocol without intravenous contrast. Multiplanar CT image reconstructions of the maxillofacial structures were also generated. COMPARISON:  None. FINDINGS: CT HEAD FINDINGS Brain: There is mild diffuse cerebral atrophy, greater than would be expected for patient age. No abnormal intra or extra-axial mass lesion or fluid collection. No abnormal mass effect or midline shift. No evidence of acute intracranial hemorrhage or infarct. Ventricular size is normal. Cerebellum unremarkable. Vascular: Unremarkable  Skull: Intact Sinuses/Orbits: Paranasal sinuses are clear. Orbits are unremarkable. Other: There is fluid opacification of the right mastoid air cells and middle ear cavity. Left mastoid air cells and middle ear cavity are clear. CT MAXILLOFACIAL FINDINGS Osseous: No fracture or mandibular dislocation. No destructive process. Orbits: Negative. No traumatic or inflammatory finding. Sinuses: Clear. Soft tissues: There is mild soft tissue swelling and subcutaneous gas seen superficial to the mandibular mentum. IMPRESSION: No acute intracranial injury.  No calvarial fracture. Mild diffuse cerebral atrophy, greater than would be expected for patient age. No acute facial fracture. Mild soft tissue swelling superficial to the mandibular mentum. Electronically Signed   By: Helyn Numbers MD   On: 06/18/2020 03:51       Subjective: Patient was seen and examined at bedside.  Overnight events noted.  Patient has ambulated well.   He feels strong enough to be discharged to detox facility today, mother is at bedside.  Discharge Exam: Vitals:   06/25/20 0700 06/25/20 1325  BP: 102/61 (!) 98/56  Pulse: 84 73  Resp:  18  Temp:  97.7 F (36.5 C)  SpO2:  95%   Vitals:   06/24/20 2042 06/25/20 0623 06/25/20 0700 06/25/20 1325  BP: 109/60 (!) 90/49 102/61 (!) 98/56  Pulse: 81 78 84 73  Resp: Temp: 98.9 F (37.2 C) 97.9 F (36.6 C)  97.7 F (36.5 C)  TempSrc: Oral Oral  Oral  SpO2: 97% 98%  95%  Weight:      Height:        General: Pt is alert, awake, not in acute distress Cardiovascular: RRR, S1/S2 +, no rubs, no gallops Respiratory: CTA bilaterally, no wheezing, no rhonchi Abdominal: Soft,  NT, ND, bowel sounds + Extremities: no edema, no cyanosis    The results of significant diagnostics from this hospitalization (including imaging, microbiology, ancillary and laboratory) are listed below for reference.     Microbiology: Recent Results (from the past 240 hour(s))  Resp Panel by  RT-PCR (Flu A&B, Covid) Nasopharyngeal Swab     Status: None   Collection Time: 06/16/20  9:43 PM   Specimen: Nasopharyngeal Swab; Nasopharyngeal(NP) swabs in vial transport medium  Result Value Ref Range Status   SARS Coronavirus 2 by RT PCR NEGATIVE NEGATIVE Final    Comment: (NOTE) SARS-CoV-2 target nucleic acids are NOT DETECTED.  The SARS-CoV-2 RNA is generally detectable in upper respiratory specimens during the acute phase of infection. The lowest concentration of SARS-CoV-2 viral copies this assay can detect is 138 copies/mL. A negative result does not preclude SARS-Cov-2 infection and should not be used as the sole basis for treatment or other patient management decisions. A negative result may occur with  improper specimen collection/handling, submission of specimen other than nasopharyngeal swab, presence of viral mutation(s) within the areas targeted by this assay, and inadequate number of viral copies(<138 copies/mL). A negative result must be combined with clinical observations, patient history, and epidemiological information. The expected result is Negative.  Fact Sheet for Patients:  BloggerCourse.com  Fact Sheet for Healthcare Providers:  SeriousBroker.it  This test is no t yet approved or cleared by the [place] FDA and  has been authorized for detection and/or diagnosis of SARS-CoV-2 by FDA under an Emergency Use Authorization (EUA). This EUA will remain  in effect (meaning this test can be used) for the duration of the COVID-19 declaration under Section 564(b)(1) of the Act, 21 U.S.C.section 360bbb-3(b)(1), unless the authorization is terminated  or revoked sooner.       Influenza A by PCR NEGATIVE NEGATIVE Final   Influenza B by PCR NEGATIVE NEGATIVE Final    Comment: (NOTE) The Xpert Xpress SARS-CoV-2/FLU/RSV plus assay is intended as an aid in the diagnosis of influenza from Nasopharyngeal swab  specimens and should not be used as a sole basis for treatment. Nasal washings and aspirates are unacceptable for Xpert Xpress SARS-CoV-2/FLU/RSV testing.  Fact Sheet for Patients: BloggerCourse.com  Fact Sheet for Healthcare Providers: SeriousBroker.it  This test is not yet approved or cleared by the [place] FDA and has been authorized for detection and/or diagnosis of SARS-CoV-2 by FDA under an Emergency Use Authorization (EUA). This EUA will remain in effect (meaning this test can be used) for the duration of the COVID-19 declaration under Section 564(b)(1) of the Act, 21 U.S.C. section 360bbb-3(b)(1), unless the authorization is terminated or revoked.  Performed at [facility], [address] [place], [place] [address]      Labs: BNP (last 3 results) No results for input(s): BNP in the last 8760 hours. Basic Metabolic Panel: Recent Labs  Lab 06/19/20 0326 06/20/20 0337 06/21/20 0905 06/22/20 0336 06/25/20 0336  NA 135 134* 133* 135 135  K 3.8 3.8 3.4* 3.5 3.9  CL 99 99 102 103 101  CO2 GLUCOSE 88 91 104* 89 110*  BUN <5* 5* CREATININE 0.52* 0.61 0.66 0.61 0.66  CALCIUM 9.2 9.5 9.2 9.4 9.4  MG 1.5* 1.7 1.7 1.7  --   PHOS 3.4  --  3.0 3.9  --    Liver Function Tests: Recent Labs  Lab 06/19/20 0326 06/20/20 6045 06/21/20 0137 06/21/20 4098  06/22/20 0336  AST 61* 53* 47* 42* 40  ALT 29 26 24 24 22   ALKPHOS 89 98 105 102 103  BILITOT 7.7* 8.4* 8.3* 8.9* 8.4*  PROT 7.5 8.2* 8.6* 8.1 8.1  ALBUMIN 3.3* 3.4* 3.6 3.5 3.6   Recent Labs  Lab 06/19/20 0326  LIPASE 52*   No results for input(s): AMMONIA in the last 168 hours. CBC: Recent Labs  Lab 06/21/20 0137 06/21/20 0905 06/22/20 0336 06/23/20 0314 06/25/20 0336  WBC 5.8 5.0 4.5 4.9 5.5  HGB 8.7* 8.3* 8.5* 8.3* 9.2*  HCT 26.0* 24.8* 25.6* 25.0* 27.6*  MCV 102.0* 102.5* 101.2* 102.5* 101.5*  PLT  84* 88* 102* 128* 170   Cardiac Enzymes: No results for input(s): CKTOTAL, CKMB, CKMBINDEX, TROPONINI in the last 168 hours. BNP: Invalid input(s): POCBNP CBG: No results for input(s): GLUCAP in the last 168 hours. D-Dimer No results for input(s): DDIMER in the last 72 hours. Hgb A1c No results for input(s): HGBA1C in the last 72 hours. Lipid Profile No results for input(s): CHOL, HDL, LDLCALC, TRIG, CHOLHDL, LDLDIRECT in the last 72 hours. Thyroid function studies No results for input(s): TSH, T4TOTAL, T3FREE, THYROIDAB in the last 72 hours.  Invalid input(s): FREET3 Anemia work up No results for input(s): VITAMINB12, FOLATE, FERRITIN, TIBC, IRON, RETICCTPCT in the last 72 hours. Urinalysis    Component Value Date/Time   COLORURINE AMBER (A) 06/17/2020 0000   APPEARANCEUR CLEAR 06/17/2020 0000   LABSPEC 1.008 06/17/2020 0000   PHURINE 8.0 06/17/2020 0000   GLUCOSEU NEGATIVE 06/17/2020 0000   HGBUR NEGATIVE 06/17/2020 0000   BILIRUBINUR NEGATIVE 06/17/2020 0000   KETONESUR NEGATIVE 06/17/2020 0000   PROTEINUR NEGATIVE 06/17/2020 0000   NITRITE NEGATIVE 06/17/2020 0000   LEUKOCYTESUR NEGATIVE 06/17/2020 0000   Sepsis Labs Invalid input(s): PROCALCITONIN,  WBC,  LACTICIDVEN Microbiology Recent Results (from the past 240 hour(s))  Resp Panel by RT-PCR (Flu A&B, Covid) Nasopharyngeal Swab     Status: None   Collection Time: 06/16/20  9:43 PM   Specimen: Nasopharyngeal Swab; Nasopharyngeal(NP) swabs in vial transport medium  Result Value Ref Range Status   SARS Coronavirus 2 by RT PCR NEGATIVE NEGATIVE Final    Comment: (NOTE) SARS-CoV-2 target nucleic acids are NOT DETECTED.  The SARS-CoV-2 RNA is generally detectable in upper respiratory specimens during the acute phase of infection. The lowest concentration of SARS-CoV-2 viral copies this assay can detect is 138 copies/mL. A negative result does not preclude SARS-Cov-2 infection and should not be used as the sole  basis for treatment or other patient management decisions. A negative result may occur with  improper specimen collection/handling, submission of specimen other than nasopharyngeal swab, presence of viral mutation(s) within the areas targeted by this assay, and inadequate number of viral copies(<138 copies/mL). A negative result must be combined with clinical observations, patient history, and epidemiological information. The expected result is Negative.  Fact Sheet for Patients:  06/18/20  Fact Sheet for Healthcare Providers:  BloggerCourse.com  This test is no t yet approved or cleared by the [place] FDA and  has been authorized for detection and/or diagnosis of SARS-CoV-2 by FDA under an Emergency Use Authorization (EUA). This EUA will remain  in effect (meaning this test can be used) for the duration of the COVID-19 declaration under Section 564(b)(1) of the Act, 21 U.S.C.section 360bbb-3(b)(1), unless the authorization is terminated  or revoked sooner.       Influenza A by PCR NEGATIVE NEGATIVE Final   Influenza B by  PCR NEGATIVE NEGATIVE Final    Comment: (NOTE) The Xpert Xpress SARS-CoV-2/FLU/RSV plus assay is intended as an aid in the diagnosis of influenza from Nasopharyngeal swab specimens and should not be used as a sole basis for treatment. Nasal washings and aspirates are unacceptable for Xpert Xpress SARS-CoV-2/FLU/RSV testing.  Fact Sheet for Patients: BloggerCourse.com  Fact Sheet for Healthcare Providers: SeriousBroker.it  This test is not yet approved or cleared by the [place] FDA and has been authorized for detection and/or diagnosis of SARS-CoV-2 by FDA under an Emergency Use Authorization (EUA). This EUA will remain in effect (meaning this test can be used) for the duration of the COVID-19 declaration under Section 564(b)(1) of the Act,  21 U.S.C. section 360bbb-3(b)(1), unless the authorization is terminated or revoked.  Performed at [facility], [address] [place], [place] [address]      Time coordinating discharge: Over 30 minutes  SIGNED:   Cipriano Bunker, MD  [facility] 06/25/2020, 3:59 PM Pager   If 7PM-7AM, please contact night-coverage [email or web address]

## 2020-06-23 NOTE — Plan of Care (Signed)
  Problem: Education: Goal: Knowledge of General Education information will improve Description: Including pain rating scale, medication(s)/side effects and non-pharmacologic comfort measures 06/23/2020 1327 by Charmian Muff, RN Outcome: Adequate for Discharge 06/23/2020 1326 by Charmian Muff, RN Outcome: Progressing   Problem: Health Behavior/Discharge Planning: Goal: Ability to manage health-related needs will improve 06/23/2020 1327 by Charmian Muff, RN Outcome: Adequate for Discharge 06/23/2020 1326 by Charmian Muff, RN Outcome: Progressing   Problem: Clinical Measurements: Goal: Ability to maintain clinical measurements within normal limits will improve 06/23/2020 1327 by Charmian Muff, RN Outcome: Adequate for Discharge 06/23/2020 1326 by Charmian Muff, RN Outcome: Progressing Goal: Will remain free from infection 06/23/2020 1327 by Charmian Muff, RN Outcome: Adequate for Discharge 06/23/2020 1326 by Charmian Muff, RN Outcome: Progressing Goal: Diagnostic test results will improve 06/23/2020 1327 by Charmian Muff, RN Outcome: Adequate for Discharge 06/23/2020 1326 by Charmian Muff, RN Outcome: Progressing Goal: Respiratory complications will improve Outcome: Adequate for Discharge Goal: Cardiovascular complication will be avoided Outcome: Adequate for Discharge   Problem: Activity: Goal: Risk for activity intolerance will decrease Outcome: Adequate for Discharge   Problem: Nutrition: Goal: Adequate nutrition will be maintained Outcome: Adequate for Discharge   Problem: Coping: Goal: Level of anxiety will decrease Outcome: Adequate for Discharge   Problem: Elimination: Goal: Will not experience complications related to bowel motility Outcome: Adequate for Discharge Goal: Will not experience complications related to urinary retention Outcome: Adequate for Discharge   Problem: Pain Managment: Goal: General experience of comfort  will improve Outcome: Adequate for Discharge   Problem: Safety: Goal: Ability to remain free from injury will improve Outcome: Adequate for Discharge   Problem: Skin Integrity: Goal: Risk for impaired skin integrity will decrease Outcome: Adequate for Discharge

## 2020-06-23 NOTE — TOC Transition Note (Signed)
Transition of Care [facility]) - CM/SW Discharge Note  Patient Details  Name: Nathan Osborne MRN: [id number] Date of Birth: Nov 08, 1985  [facility] of [facility]) CM/SW Contact:  Ewing Schlein, LCSW Phone Number: 06/23/2020, 1:23 PM  Clinical Narrative: CSW spoke with Shanda Bumps in admissions and Peyton Najjar at [facility] regarding patient being able to return today. However, per the facility's review of PT notes, patient would require some hands-on assistance so the facility requires that he have a sitter, costing $625/day as it is not covered by insurance. Per patient and mother, they cannot afford a sitter until patient is more stable with his ambulation, so mother will take patient home.  PT evaluation recommended HHPT. CSW contacted [facility] agencies and was declined by [facility] and [facility] as [facility] is outside of their coverage area. CSW did not receive a response from [facility] or [facility]. CSW made rolling walker referral to [place] with [facility]. Order has been placed; [facility] to deliver RW to patient's room. TOC signing off.  Final next level of care: Home/Self Care Barriers to Discharge: Barriers Resolved  Discharge Plan and Services        DME Arranged: Walker rolling DME Agency: [facility] Date DME Agency Contacted: 06/23/20 Time DME Agency Contacted: [phone] Representative spoke with at DME Agency: Velna Hatchet  Readmission Risk Interventions No flowsheet data found.

## 2020-06-23 NOTE — Discharge Instructions (Signed)
Patient is being discharged to detox facility for rehab.

## 2020-06-23 NOTE — Progress Notes (Signed)
Physical Therapy Treatment Patient Details Name: Nathan Osborne MRN: [id number] DOB: [birth year] Today's Date: 06/23/2020    History of Present Illness 35 year old male with PMH significant of chronic alcohol abuse who was sent from alcohol detox facility due to concerns for DTs.    PT Comments    UE/LEs remain tremulous. Pt remains at risk for falls when mobilizing. Recommend continued RW use for ambulation safety at this time. He is weak and fatigues fairly easily. HR up to 139 bpm, dyspnea 2/4 with short distance ambulation. Seated rest breaks taken/needed after ~55 feet ambulation distance on today;walked x 2. Do not feel pt is safe to mobilize unassisted at this time. Feel pt could benefit from continued therapy in hospital prior to return to substance abuse rehab. Encouraged pt to increase activity today if at all possible-try to ambulate 2-3x/day in hallway with assistance.     Follow Up Recommendations  Home health PT;Supervision/assistance for mobility/OOB     Equipment Recommendations  Rolling walker with 5" wheels    Recommendations for Other Services       Precautions / Restrictions Precautions Precautions: Fall Precaution Comments: monitor HR Restrictions Weight Bearing Restrictions: No    Mobility  Bed Mobility Overal bed mobility: Needs Assistance Bed Mobility: Supine to Sit     Supine to sit: Min guard;HOB elevated     General bed mobility comments: Increased time and effort. Multiple attempts to get to EOB. Cues for technique.    Transfers Overall transfer level: Needs assistance Equipment used: Rolling walker (2 wheeled) Transfers: Sit to/from Stand Sit to Stand: Min guard         General transfer comment: Cues for safety, technique, hand placement. Tremulous. Min guard for safety. Sit to stand x 3- 2* fatigue, weakness (pt stood for ~ then sat back down)  Ambulation/Gait Ambulation/Gait assistance: Min assist Gait Distance (Feet):  55 Feet (x2) Assistive device: Rolling walker (2 wheeled) Gait Pattern/deviations: Step-through pattern;Decreased stride length;Decreased step length - right;Decreased step length - left     General Gait Details: Cues for safety, posture, proper use of RW. Unsteady and tremulous. Fatigues fairly easily. HR up to 139 bpm, dyspnea 2/4. Seated rest break between walks 2* fatigue, weakness.   Stairs             Wheelchair Mobility    Modified Rankin (Stroke Patients Only)       Balance Overall balance assessment: Needs assistance         Standing balance support: Bilateral upper extremity supported Standing balance-Leahy Scale: Fair                              Cognition Arousal/Alertness: Awake/alert Behavior During Therapy: WFL for tasks assessed/performed Overall Cognitive Status: Impaired/Different from baseline                       Memory: Decreased short-term memory       Problem Solving: Requires verbal cues General Comments: mostly WFL but still not yet at baseline      Exercises      General Comments        Pertinent Vitals/Pain Pain Assessment: Faces Faces Pain Scale: Hurts little more Pain Location: R elbow Pain Descriptors / Indicators: Discomfort;Sore Pain Intervention(s): Limited activity within patient's tolerance;Monitored during session    Home Living  Prior Function            PT Goals (current goals can now be found in the care plan section) Progress towards PT goals: Progressing toward goals    Frequency    Min 3X/week      PT Plan Current plan remains appropriate    Co-evaluation              AM-PAC PT "6 Clicks" Mobility   Outcome Measure  Help needed turning from your back to your side while in a flat bed without using bedrails?: A Little Help needed moving from lying on your back to sitting on the side of a flat bed without using bedrails?: A Little Help  needed moving to and from a bed to a chair (including a wheelchair)?: A Little Help needed standing up from a chair using your arms (e.g., wheelchair or bedside chair)?: A Little Help needed to walk in hospital room?: A Little Help needed climbing 3-5 steps with a railing? : A Little 6 Click Score: 18    End of Session Equipment Utilized During Treatment: Gait belt Activity Tolerance: Patient limited by fatigue Patient left: in chair;with call bell/phone within reach;with family/visitor present   PT Visit Diagnosis: Muscle weakness (generalized) (M62.81);Other abnormalities of gait and mobility (R26.89)     Time: 8032-1224 PT Time Calculation (min) (ACUTE ONLY): 22 min  Charges:  $Gait Training: 8-22 mins                         Faye Ramsay, PT Acute Rehabilitation  Office: [phone] Pager: [phone]

## 2020-06-23 NOTE — Plan of Care (Signed)

## 2020-06-24 NOTE — Progress Notes (Signed)
PROGRESS NOTE    Nathan Osborne  SWF:[id number] DOB: [birth year] DOA: 06/16/2020 PCP: Pcp, No   Brief Narrative:  This 35 year old male with PMH significant of chronic alcohol abuse who was sent from alcohol detox facility due to concerns for DTs.He did not respond to Librium and Valium at detox facility. He was drinking 10 glasses of bourbon a day. He was found to have multiple bruises on his extremities and around his body. Multiple imagings did not show any acute finding. Liver imaging showed splenomegaly, portal hypertension, fatty infiltration versus diffuse hepatocellular disease. CT abdomen showed possible mild acute pancreatitis and has mild elevated lipase. He was found to have hemoglobin 6.6, platelet count 16 K. Patient was given PRBC and platelet transfusions in the ED. Hemoglobin improved to 8.5. Platelet improved to 88 K. Urine drug screen positive for marijuana and benzos. Patient was admitted for alcohol withdrawal and he was started on CIWA protocol.  Given low hemoglobin and plateletsGI was consulted for possible acute blood loss anemia. Patient was hemodynamically stable. There is no evidence of active bleeding at this time.  He was started on octreotide and Protonix infusion. GI was initially planning for EGD before discharge but then decided no indication at this time.  Anemia and thrombocytopenia was secondary to pancytopenia from alcohol intake.  Stool for occult blood was negative.  GI signed off, recommended no need for any GI intervention at this point,  recommended outpatient follow-up in 2 to 3 weeks.  Patient has participated in physical therapy recommended home PT and OT.  Patient feels better,  alert and oriented and wants to be discharged back to detox facility for continuation of alcohol rehab.  Note: Pt has unsteadiness while ambulation. He is at high risk for falls.  [facility] detox facility states he needs a Comptroller and he has to pay out-of-pocket  for that services since he is needing physical therapy.  Mother reports she cannot afford and she wants to take him home and patient is agreeable.  Patient was not discharged yesterday. Patient is participating in physical therapy to get some strength before he can be discharged to detox facility in 1 to 2 days.  Assessment & Plan:   Principal Problem:   Alcohol withdrawal (HCC) Active Problems:   Encephalopathy   Thrombocytopenia (HCC)   Macrocytic anemia   Pancreatitis   Hypokalemia   Hyponatremia   Jaundice   Hyperbilirubinemia   Laceration of chin   Alcohol withdrawal / concerns for DT.: Patient presented with alcohol withdrawal symptoms, not controlled with Librium and Valium. Admitted to Progressive care unit. Continue CIWA protocol . Continue ativan as needed for elevated CIWA scores.  Continue librium.  Continue folic acid, thiamine and MVI daily. Fall precautions.   Active Problems: Thrombocytopenia:> Improving. Patient presented with platelet count of 16.0 and bruising all over the body. Patient has received platelet transfusion in the ER. Posttransfusion platelet improved to 45K., trending up 60 K > 72 K >88>102>128  Macrocytic anemia: This could be due to EtOH. Vitamin B12 is normal. S/p 1 PRBC. Hb improved from 6.6->7.7>8.0> 8.5>8.3>8.5 No evidence of active bleeding at this time. GI was consulted.  It does not appear any acute GI problem. GI recommended it would be reasonable to consider endoscopy and colonoscopy for the evaluation of anemia once patient is medically stable. Octreotide and Protonix infusions were discontinued. This could be sec. to pancytopenia as result of bone marrow suppression from alcohol. Outpatient GI follow-up has been scheduled.  Pancreatitis He  has mildly elevated lipase and CT consistent with pancreatitis. He denies any abdominal pain,  continue supportive care. Lipase is trending down.  Hyperbilirubinemia :  Abdominal  ultrasound showed no gallstones, portal hypertension, fatty infiltration.   Could be due to alcoholic liver disease. Continue supportive care,  total bilirubin remains stable.  Hypokalemia : Improved.Continue to [place].  Hyponatremia > improved. Monitor electrolytes.   Metabolic Encephalopathy : > Improved. Secondary to alcohol withdrawal Improving.  CT head negative for acute abnormality.   DVT prophylaxis: SCDs Code Status: Full code. Family Communication: Parents at bed side. Disposition Plan:   Status is: Inpatient  Remains inpatient appropriate because:Inpatient level of care appropriate due to severity of illness   Dispo: The patient is from: Home              Anticipated d/c is to: Detox facility in 1-2 days.              Patient currently is not medically stable to d/c.   Difficult to place patient No  Consultants:   GI  Procedures:  Antimicrobials:   Anti-infectives (From admission, onward)   None      Subjective: Patient was seen and examined at bedside. Overnight events noted.  Patient is more alert and awake, he is very motivated.  He has participated in physical therapy twice since morning.  He wants to be discharged to detox facility. Mother was at bedside.  Objective: Vitals:   06/23/20 0525 06/23/20 1352 06/23/20 2008 06/24/20 0536  BP: 101/62 (!) 112/55 (!) 105/59 112/62  Pulse: 69 94 87 69  Resp:  18 14 17   Temp: 98.2 F (36.8 C) 98.3 F (36.8 C) 99.3 F (37.4 C) 98.7 F (37.1 C)  TempSrc: Oral Oral Oral Oral  SpO2: 96% 96% 96% 96%  Weight:      Height:       No intake or output data in the 24 hours ending 06/24/20 1215 Filed Weights   06/16/20 2050 06/17/20 1535  Weight: 85.7 kg 73.5 kg    Examination:  General exam: Appears calm and comfortable, not in any acute distress.   Respiratory system: Clear to auscultation. Respiratory effort normal. Cardiovascular system: S1 & S2 heard, RRR. No JVD, murmurs, rubs, gallops or  clicks. No pedal edema. Gastrointestinal system: Abdomen is nondistended, soft and nontender. No organomegaly or masses felt.  Normal bowel sounds heard. Central nervous system: Alert and oriented x 3 . No focal neurological deficits. Extremities: Multiple bruising noted.  Big bruise noted on the right elbow. Skin: No rashes, lesions or ulcers Psychiatry: Judgement and insight appear normal. Mood & affect appropriate.     Data Reviewed: I have personally reviewed following labs and imaging studies  CBC: Recent Labs  Lab 06/20/20 0337 06/21/20 0137 06/21/20 0905 06/22/20 0336 06/23/20 0314  WBC 5.8 5.8 5.0 4.5 4.9  HGB 8.5* 8.7* 8.3* 8.5* 8.3*  HCT 25.3* 26.0* 24.8* 25.6* 25.0*  MCV 104.5* 102.0* 102.5* 101.2* 102.5*  PLT 72* 84* 88* 102* 128*   Basic Metabolic Panel: Recent Labs  Lab 06/18/20 0746 06/19/20 0326 06/20/20 0337 06/21/20 0905 06/22/20 0336  NA 134* 135 134* 133* 135  K 3.7 3.8 3.8 3.4* 3.5  CL 98 99 99 102 103  CO2 26 28 25 24 25   GLUCOSE 125* 88 91 104* 89  BUN 6 <5* 5* 7 7  CREATININE 0.69 0.52* 0.61 0.66 0.61  CALCIUM 9.2 9.2 9.5 9.2 9.4  MG  --  1.5* 1.7  1.7 1.7  PHOS  --  3.4  --  3.0 3.9   GFR: Estimated Creatinine Clearance: 128.3 mL/min (by C-G formula based on SCr of 0.61 mg/dL). Liver Function Tests: Recent Labs  Lab 06/19/20 0326 06/20/20 0337 06/21/20 0137 06/21/20 0905 06/22/20 0336  AST 61* 53* 47* 42* 40  ALT 29 26 24 24 22   ALKPHOS 89 98 105 102 103  BILITOT 7.7* 8.4* 8.3* 8.9* 8.4*  PROT 7.5 8.2* 8.6* 8.1 8.1  ALBUMIN 3.3* 3.4* 3.6 3.5 3.6   Recent Labs  Lab 06/19/20 0326  LIPASE 52*   No results for input(s): AMMONIA in the last 168 hours. Coagulation Profile: Recent Labs  Lab 06/18/20 0746  INR 1.8*   Cardiac Enzymes: No results for input(s): CKTOTAL, CKMB, CKMBINDEX, TROPONINI in the last 168 hours. BNP (last 3 results) No results for input(s): PROBNP in the last 8760 hours. HbA1C: No results for input(s):  HGBA1C in the last 72 hours. CBG: Recent Labs  Lab 06/17/20 2005  GLUCAP 218*   Lipid Profile: No results for input(s): CHOL, HDL, LDLCALC, TRIG, CHOLHDL, LDLDIRECT in the last 72 hours. Thyroid Function Tests: No results for input(s): TSH, T4TOTAL, FREET4, T3FREE, THYROIDAB in the last 72 hours. Anemia Panel: No results for input(s): VITAMINB12, FOLATE, FERRITIN, TIBC, IRON, RETICCTPCT in the last 72 hours. Sepsis Labs: No results for input(s): PROCALCITON, LATICACIDVEN in the last 168 hours.  Recent Results (from the past 240 hour(s))  Resp Panel by RT-PCR (Flu A&B, Covid) Nasopharyngeal Swab     Status: None   Collection Time: 06/16/20  9:43 PM   Specimen: Nasopharyngeal Swab; Nasopharyngeal(NP) swabs in vial transport medium  Result Value Ref Range Status   SARS Coronavirus 2 by RT PCR NEGATIVE NEGATIVE Final    Comment: (NOTE) SARS-CoV-2 target nucleic acids are NOT DETECTED.  The SARS-CoV-2 RNA is generally detectable in upper respiratory specimens during the acute phase of infection. The lowest concentration of SARS-CoV-2 viral copies this assay can detect is 138 copies/mL. A negative result does not preclude SARS-Cov-2 infection and should not be used as the sole basis for treatment or other patient management decisions. A negative result may occur with  improper specimen collection/handling, submission of specimen other than nasopharyngeal swab, presence of viral mutation(s) within the areas targeted by this assay, and inadequate number of viral copies(<138 copies/mL). A negative result must be combined with clinical observations, patient history, and epidemiological information. The expected result is Negative.  Fact Sheet for Patients:  06/18/20  Fact Sheet for Healthcare Providers:  BloggerCourse.com  This test is no t yet approved or cleared by the [employer] FDA and  has been authorized for  detection and/or diagnosis of SARS-CoV-2 by FDA under an Emergency Use Authorization (EUA). This EUA will remain  in effect (meaning this test can be used) for the duration of the COVID-19 declaration under Section 564(b)(1) of the Act, 21 U.S.C.section 360bbb-3(b)(1), unless the authorization is terminated  or revoked sooner.       Influenza A by PCR NEGATIVE NEGATIVE Final   Influenza B by PCR NEGATIVE NEGATIVE Final    Comment: (NOTE) The Xpert Xpress SARS-CoV-2/FLU/RSV plus assay is intended as an aid in the diagnosis of influenza from Nasopharyngeal swab specimens and should not be used as a sole basis for treatment. Nasal washings and aspirates are unacceptable for Xpert Xpress SARS-CoV-2/FLU/RSV testing.  Fact Sheet for Patients: [place]  Fact Sheet for Healthcare Providers: BloggerCourse.com  This test is not yet approved  or cleared by the [place] and has been authorized for detection and/or diagnosis of SARS-CoV-2 by FDA under an Emergency Use Authorization (EUA). This EUA will remain in effect (meaning this test can be used) for the duration of the COVID-19 declaration under Section 564(b)(1) of the Act, 21 U.S.C. section 360bbb-3(b)(1), unless the authorization is terminated or revoked.  Performed at [facility], [address], [place], [place] [address]     Radiology Studies: No results found. Scheduled Meds: . chlordiazePOXIDE  25 mg Oral TID  . folic acid  1 mg Oral Daily  . LORazepam  6 mg Intravenous Once  . multivitamin with minerals  1 tablet Oral Daily  . nicotine  14 mg Transdermal Daily  . pantoprazole  40 mg Oral BID  . sertraline  50 mg Oral Daily  . thiamine  100 mg Oral Daily   Or  . thiamine  100 mg Intravenous Daily  . traZODone  50 mg Oral QHS   Continuous Infusions: . sodium chloride    . lactated ringers 100 mL/hr at 06/23/20 0208     LOS: 7  days    Time spent: 25 mins    Nathan Spanbauer, MD [facility]   If 7PM-7AM, please contact night-coverage

## 2020-06-24 NOTE — Progress Notes (Addendum)
Physical Therapy Treatment Patient Details Name: Nathan Osborne MRN: [id number] DOB: 1985-09-09 Today's Date: 06/24/2020    History of Present Illness 35 year old male with PMH significant of chronic alcohol abuse who was sent from alcohol detox facility due to concerns for DTs.    PT Comments    Pt and family report just finished walk in the hallway!-great. Pt agreeable to working with PT. Still has some short term memory issues and requires some cues for safety. Walked ~110 feet x 2 with RW. Performed some standing exercises, 5 reps of sit to stands, side stepping, and backwards walking to challenge balance and work on strength, endurance. 2 brief seated rest breaks throughout session. Gait is mildly ataxic especially with tasks that require higher level of coordination. No LOB with RW use so encouraged pt to continue use for safety. Pt and family report they plan to walk more throughout the day. Encouraged pt to perform 5 reps of sit to stands from recliner after each walk. Encouraged him to spend as much time OOB as possible (sitting up in recliner, ambulating with supervision). PT will continue to follow and progress activity.      Follow Up Recommendations  Supervision for mobility/OOB;Outpatient PT     Equipment Recommendations  Rolling walker with 5" wheels    Recommendations for Other Services       Precautions / Restrictions Precautions Precautions: Fall Precaution Comments: monitor HR Restrictions Weight Bearing Restrictions: No    Mobility  Bed Mobility  Bed Mobility: Supine to Sit     Supine to sit: Mod Independent;HOB elevated     General bed mobility comments: Increased time and effort for pt-no physical assistance provided.  Transfers Overall transfer level: Needs assistance Equipment used: Rolling walker (2 wheeled) Transfers: Sit to/from Stand Sit to Stand: Supervision          General transfer comment: Supv for safety. Cues for safety, hand  placement.  Ambulation/Gait Ambulation/Gait assistance: Min guard Gait Distance (Feet): 110 Feet Assistive device: Rolling walker (2 wheeled) Gait Pattern/deviations: Decreased step length - right;Decreased step length - left;Decreased stride length;Trunk flexed     General Gait Details: Cues for posture, proper use of RW/RW proximity, step/stride length. Min guard for safety. Remains unsteady but no LOB with RW use. Still fatigues fairly easily.   Stairs             Wheelchair Mobility    Modified Rankin (Stroke Patients Only)       Balance Overall balance assessment: Needs assistance         Standing balance support: Bilateral upper extremity supported Standing balance-Leahy Scale: Fair               High level balance activites: Side stepping;Backward walking High Level Balance Comments: Side stepping x 2 ~10 feet to L and R sides with 1 hand support of hallway handrail. Backwards walking x 2 ~10 feet with 1 hand support of hallway handrail. Unsteady. Mildly ataxic.            Cognition Arousal/Alertness: Awake/alert Behavior During Therapy: WFL for tasks assessed/performed Overall Cognitive Status: Within Functional Limits for tasks assessed (mostly-some issues still with short term memory and cues for safety)                                        Exercises General Exercises - Lower Extremity Hip Flexion/Marching: AROM;Both;15 reps;Standing Heel  Raises: AROM;Both;15 reps;Standing    General Comments        Pertinent Vitals/Pain Pain Assessment: Faces Faces Pain Scale: Hurts little more Pain Descriptors / Indicators: Discomfort;Sore Pain Intervention(s): Monitored during session    Home Living                      Prior Function            PT Goals (current goals can now be found in the care plan section) Progress towards PT goals: Progressing toward goals    Frequency    Min 3X/week      PT Plan  Current plan remains appropriate    Co-evaluation              AM-PAC PT "6 Clicks" Mobility   Outcome Measure  Help needed turning from your back to your side while in a flat bed without using bedrails?: A Little Help needed moving from lying on your back to sitting on the side of a flat bed without using bedrails?: A Little Help needed moving to and from a bed to a chair (including a wheelchair)?: A Little Help needed standing up from a chair using your arms (e.g., wheelchair or bedside chair)?: A Little Help needed to walk in hospital room?: A Little Help needed climbing 3-5 steps with a railing? : A Little 6 Click Score: 18    End of Session Equipment Utilized During Treatment: Gait belt Activity Tolerance: Patient limited by fatigue Patient left: in chair;with call bell/phone within reach;with family/visitor present   PT Visit Diagnosis: Muscle weakness (generalized) (M62.81);Other abnormalities of gait and mobility (R26.89)     Time: 9563-8756 PT Time Calculation (min) (ACUTE ONLY): 24 min  Charges:  $Gait Training: 23-37 mins                         Faye Ramsay, PT Acute Rehabilitation  Office: [phone] Pager: [phone]

## 2020-06-24 NOTE — TOC Progression Note (Signed)
Transition of Care [facility]) - Progression Note    Patient Details  Name: Nathan Osborne MRN: [id number] Date of Birth: 16-Jan-1986  [facility] of [facility]) CM/SW Contact  Darleene Cleaver, [place] Phone Number: 06/24/2020, 6:13 PM  Clinical Narrative:    CSW attempted to speak to someone at [facility] left a message waiting for a call back.     Barriers to Discharge: Barriers Resolved  Expected Discharge Plan and Services           Expected Discharge Date: 06/23/20               DME Arranged: Dan Humphreys rolling DME Agency: [employer] Date DME Agency Contacted: 06/23/20 Time DME Agency Contacted: [phone] Representative spoke with at DME Agency: Velna Hatchet             Social Determinants of Health (SDOH) Interventions    Readmission Risk Interventions No flowsheet data found.

## 2020-06-24 NOTE — Plan of Care (Signed)

## 2020-06-25 LAB — CBC
HCT: 27.6 % — ABNORMAL LOW (ref 39.0–52.0)
Hemoglobin: 9.2 g/dL — ABNORMAL LOW (ref 13.0–17.0)
MCH: 33.8 pg (ref 26.0–34.0)
MCHC: 33.3 g/dL (ref 30.0–36.0)
MCV: 101.5 fL — ABNORMAL HIGH (ref 80.0–100.0)
Platelets: 170 K/uL (ref 150–400)
RBC: 2.72 MIL/uL — ABNORMAL LOW (ref 4.22–5.81)
RDW: 17.9 % — ABNORMAL HIGH (ref 11.5–15.5)
WBC: 5.5 K/uL (ref 4.0–10.5)
nRBC: 0 % (ref 0.0–0.2)

## 2020-06-25 LAB — BASIC METABOLIC PANEL
Anion gap: 8 (ref 5–15)
BUN: 6 mg/dL (ref 6–20)
CO2: 26 mmol/L (ref 22–32)
Calcium: 9.4 mg/dL (ref 8.9–10.3)
Chloride: 101 mmol/L (ref 98–111)
Creatinine, Ser: 0.66 mg/dL (ref 0.61–1.24)
GFR, Estimated: >60 mL/min (ref >60)
Glucose, Bld: 110 mg/dL — ABNORMAL HIGH (ref 70–99)
Potassium: 3.9 mmol/L (ref 3.5–5.1)
Sodium: 135 mmol/L (ref 135–145)

## 2020-06-25 MED ORDER — CHLORDIAZEPOXIDE HCL 25 MG PO CAPS: 25.0000 mg | ORAL_CAPSULE | Freq: Three times a day (TID) | ORAL | 0 refills | Status: DC

## 2020-06-25 MED ORDER — CHLORDIAZEPOXIDE HCL 25 MG PO CAPS: 25.0000 mg | ORAL_CAPSULE | Freq: Three times a day (TID) | ORAL | 0 refills | Status: AC

## 2020-06-25 NOTE — Progress Notes (Addendum)
Occupational Therapy Treatment Patient Details Name: Nathan Osborne MRN: [id number] DOB: 04-16-1985 Today's Date: 06/25/2020    History of present illness 35 year old male with PMH significant of chronic alcohol abuse who was sent from alcohol detox facility due to concerns for DTs.   OT comments  Patient reporting left lower extremity pain from thigh to knee. Appears muscular in nature - tender to palpation. Patient sitting up at side of bed dressed with shoes on when therapist entered room. Patient continues to demonstrate tremors in upper and lower extremities - he is reporting tremors worse today. Patient able to ambulate approx 75 feet with RW and min guard. No overt loss of balance. Able to take hands off of walker and demonstrate static standing as well as a couple feet of ambulation towards recliner with walker. Demonstrates improving balance and activity tolerance needed for home tasks. Supervision and min guard still required due to persistent tremors.    Follow Up Recommendations  Supervision/Assistance - 24 hour    Equipment Recommendations  Tub/shower seat    Recommendations for Other Services      Precautions / Restrictions Precautions Precautions: Fall Precaution Comments: monitor HR Restrictions Weight Bearing Restrictions: No       Mobility Bed Mobility               General bed mobility comments: sitting on side of bed    Transfers Overall transfer level: Needs assistance Equipment used: Rolling walker (2 wheeled) Transfers: Sit to/from Stand Sit to Stand: Supervision Stand pivot transfers: Min guard       General transfer comment: Supervision for standing and min guard to ambulate in hall approx 75 feet with RW. no overt loss of balance. Able to take hands off of walker and maintain balance and a couple of steps to recliner without walker.    Balance Overall balance assessment: Needs assistance Sitting-balance support: No upper extremity  supported;Feet supported Sitting balance-Leahy Scale: Fair     Standing balance support: Bilateral upper extremity supported Standing balance-Leahy Scale: Fair Standing balance comment: able to take hands off  briefly for clothing management                           ADL either performed or assessed with clinical judgement   ADL                                               Vision Patient Visual Report: No change from baseline     Perception     Praxis      Cognition Arousal/Alertness: Awake/alert Behavior During Therapy: WFL for tasks assessed/performed Overall Cognitive Status: Within Functional Limits for tasks assessed                                          Exercises     Shoulder Instructions       General Comments      Pertinent Vitals/ Pain       Pain Assessment: 0-10 Pain Score: 8  Pain Location: L thigh Pain Descriptors / Indicators: Discomfort;Sore Pain Intervention(s): Monitored during session;Premedicated before session  Home Living  Prior Functioning/Environment              Frequency  Min 2X/week        Progress Toward Goals  OT Goals(current goals can now be found in the care plan section)  Progress towards OT goals: Progressing toward goals  Acute Rehab OT Goals Patient Stated Goal: to get better OT Goal Formulation: With patient Time For Goal Achievement: 07/04/20  Plan Discharge plan remains appropriate    Co-evaluation                 AM-PAC OT "6 Clicks" Daily Activity     Outcome Measure   Help from another person eating meals?: None Help from another person taking care of personal grooming?: A Little Help from another person toileting, which includes using toliet, bedpan, or urinal?: A Little Help from another person bathing (including washing, rinsing, drying)?: A Little Help from another person to put  on and taking off regular upper body clothing?: A Little Help from another person to put on and taking off regular lower body clothing?: A Little 6 Click Score: 19    End of Session Equipment Utilized During Treatment: Gait belt;Rolling walker  OT Visit Diagnosis: Unsteadiness on feet (R26.81)   Activity Tolerance Patient tolerated treatment well   Patient Left in chair;with family/visitor present   Nurse Communication Mobility status        Time: 6045-4098 OT Time Calculation (min): 13 min  Charges: OT General Charges $OT Visit: 1 Visit OT Treatments $Therapeutic Activity: 8-22 mins  Nathan Osborne, OTR/L Acute Care Rehab Services  Office [phone] Pager: [phone]    Kelli Churn 06/25/2020, 10:13 AM

## 2020-06-25 NOTE — Progress Notes (Signed)
Physical Therapy Treatment Patient Details Name: Nathan Osborne MRN: [id number] DOB: [birth year] Today's Date: 06/25/2020    History of Present Illness 35 year old male with PMH significant of chronic alcohol abuse who was sent from alcohol detox facility due to concerns for DTs.    PT Comments    Increased tremors on today compared to yesterday. Pt reports 8/10 L lateral thigh pain on today. Focused on ambulation/gait training on today-deferred exercises 2* pt's pain report. Supv-Min guard for mobility on today. No LOB with RW use. Mobility is progressing but has been slow. Recommend continued PT-outpatient would probably be most beneficial if this option is feasible for pt/family.     Follow Up Recommendations  Supervision for mobility/OOB;Outpatient PT     Equipment Recommendations  Rolling walker with 5" wheels    Recommendations for Other Services       Precautions / Restrictions Precautions Precautions: Fall Precaution Comments: monitor HR Restrictions Weight Bearing Restrictions: No    Mobility  Bed Mobility               General bed mobility comments: oob in recliner    Transfers Overall transfer level: Needs assistance Equipment used: Rolling walker (2 wheeled) Transfers: Sit to/from Stand Sit to Stand: Supervision        General transfer comment: supv for safety.  Ambulation/Gait Ambulation/Gait assistance: Min guard Gait Distance (Feet): 75 Feet Assistive device: Rolling walker (2 wheeled) Gait Pattern/deviations: Decreased step length - right;Decreased step length - left;Decreased stride length     General Gait Details: Intermittent shuffling gait. Min guard for safety. No LOB with RW use. Walked ~7 feet without RW-Min guard. Not much change in gait pattern RW vs no device   Stairs             Wheelchair Mobility    Modified Rankin (Stroke Patients Only)       Balance Overall balance assessment: Needs  assistance Sitting-balance support: No upper extremity supported;Feet supported Sitting balance-Leahy Scale: Fair     Standing balance support: Bilateral upper extremity supported;No upper extremity supported Standing balance-Leahy Scale: Fair Standing balance comment: able to take hands off  briefly for clothing management                            Cognition Arousal/Alertness: Awake/alert Behavior During Therapy: WFL for tasks assessed/performed Overall Cognitive Status: Within Functional Limits for tasks assessed                                 General Comments: mostly WFL-still requires cueing      Exercises      General Comments        Pertinent Vitals/Pain Pain Assessment: 0-10 Pain Score: 8  Pain Location: L lateral thigh Pain Descriptors / Indicators: Discomfort;Sore Pain Intervention(s): Monitored during session;Ice applied    Home Living                      Prior Function            PT Goals (current goals can now be found in the care plan section) Acute Rehab PT Goals Patient Stated Goal: to get better Progress towards PT goals: Progressing toward goals    Frequency    Min 3X/week      PT Plan Current plan remains appropriate    Co-evaluation  AM-PAC PT "6 Clicks" Mobility   Outcome Measure  Help needed turning from your back to your side while in a flat bed without using bedrails?: None Help needed moving from lying on your back to sitting on the side of a flat bed without using bedrails?: None Help needed moving to and from a bed to a chair (including a wheelchair)?: A Little Help needed standing up from a chair using your arms (e.g., wheelchair or bedside chair)?: A Little Help needed to walk in hospital room?: A Little Help needed climbing 3-5 steps with a railing? : A Little 6 Click Score: 20    End of Session Equipment Utilized During Treatment: Gait belt Activity Tolerance:  Patient limited by pain Patient left: in chair;with call bell/phone within reach;with family/visitor present   PT Visit Diagnosis: Muscle weakness (generalized) (M62.81);Other abnormalities of gait and mobility (R26.89);Pain Pain - Right/Left: Left Pain - part of body: Leg     Time: 6168-3729 PT Time Calculation (min) (ACUTE ONLY): 8 min  Charges:  $Gait Training: 8-22 mins                         Faye Ramsay, PT Acute Rehabilitation  Office: [phone] Pager: [phone]

## 2020-06-25 NOTE — TOC Transition Note (Signed)
Transition of Care [facility]) - CM/SW Discharge Note   Patient Details  Name: Nathan Osborne MRN: [id number] Date of Birth: 1985/07/19  [facility] CM/SW Contact:  Darleene Cleaver, LCSW Phone Number: 06/25/2020, 2:44 PM   Clinical Narrative:     Patient will be going home with home health through [facility].  CSW signing off please reconsult with any other social work needs, home health agency has been notified of planned discharge.   Final next level of care: Home w Home Health Services Barriers to Discharge: Barriers Resolved   Patient Goals and CMS Choice Patient states their goals for this hospitalization and ongoing recovery are:: To return back home with home health. CMS Medicare.gov Compare Post Acute Care list provided to:: Patient Choice offered to / list presented to : Patient,Parent  Discharge Placement  Discharging home to mother's house.                     Discharge Plan and Services                DME Arranged: Walker rolling DME Agency: [employer] Date DME Agency Contacted: 06/23/20 Time DME Agency Contacted: [phone] Representative spoke with at DME Agency: Francesco Sor Arranged: OT,PT,Social Work HH Agency: [facility] Date HH Agency Contacted: 06/25/20 Time HH Agency Contacted: 1100 Representative spoke with at [facility] Agency: Kathlene November  Social Determinants of Health (SDOH) Interventions     Readmission Risk Interventions No flowsheet data found.

## 2020-06-25 NOTE — Progress Notes (Signed)
Pt discharged to home, instructions reviewed with pt and mother. Acknowledged understanding.. SRP,RN

## 2020-06-25 NOTE — Progress Notes (Addendum)
PROGRESS NOTE    Nathan Osborne  ZOX:[id number] DOB: March 16, 1985 DOA: 06/16/2020 PCP: Pcp, No   Brief Narrative:  This 35 year old male with PMH significant of chronic alcohol abuse who was sent from alcohol detox facility due to concerns for DTs.He did not respond to Librium and Valium at detox facility. He was drinking 10 glasses of bourbon a day. He was found to have multiple bruises on his extremities and around his body. Multiple imagings did not show any acute finding. Liver imaging showed splenomegaly, portal hypertension, fatty infiltration versus diffuse hepatocellular disease. CT abdomen showed possible mild acute pancreatitis and has mild elevated lipase. He was found to have hemoglobin 6.6, platelet count 16 K. Patient was given PRBC and platelet transfusions in the ED. Hemoglobin improved to 8.5. Platelet improved to 88 K. Urine drug screen positive for marijuana and benzos. Patient was admitted for alcohol withdrawal and he was started on CIWA protocol.  Given low hemoglobin and plateletsGI was consulted for possible acute blood loss anemia. Patient was hemodynamically stable. There is no evidence of active bleeding at this time.  He was started on octreotide and Protonix infusion. GI was initially planning for EGD before discharge but then decided no indication at this time.  Anemia and thrombocytopenia was secondary to pancytopenia from alcohol intake.  Stool for occult blood was negative.  GI signed off, recommended no need for any GI intervention at this point,  recommended outpatient follow-up in 2 to 3 weeks.  Patient has participated in physical therapy recommended home PT and OT.  Patient feels better,  alert and oriented and wants to be discharged back to detox facility for continuation of alcohol rehab.  Note: Pt has unsteadiness while ambulation. He is at high risk for falls.  [facility] detox facility states he needs a Comptroller and he has to pay out-of-pocket  for that services since he is needing physical therapy.  Mother reports she cannot afford and she wants to take him home and patient is agreeable. He has done physical therapy in the hospital has developed some endurance and some strength.  He was able to walk 200 feet.  Patient and family has decided to go home.  Patient is being discharged home and will follow up with primary care physician in 2 to 3 days.  Assessment & Plan:   Principal Problem:   Alcohol withdrawal (HCC) Active Problems:   Encephalopathy   Thrombocytopenia (HCC)   Macrocytic anemia   Pancreatitis   Hypokalemia   Hyponatremia   Jaundice   Hyperbilirubinemia   Laceration of chin   Alcohol withdrawal / concerns for DT >Resolved. Patient presented with alcohol withdrawal symptoms, not controlled with Librium and Valium. Admitted to Progressive care unit. Continue CIWA protocol . Continue ativan as needed for elevated CIWA scores.  Continue librium.  Continue folic acid, thiamine and MVI daily. Fall precautions.   Active Problems: Thrombocytopenia:> Resolved. Patient presented with platelet count of 16.0 and bruising all over the body. Patient has received platelet transfusion in the ER. Posttransfusion platelet improved to 45K., trending up 60 K > 72 K >88>102>128 >170  Macrocytic anemia: This could be due to EtOH. Vitamin B12 is normal. S/p 1 PRBC. Hb improved from 6.6->7.7>8.0> 8.5>8.3>8.5 >9.2 No evidence of active bleeding at this time. GI was consulted.  It does not appear any acute GI problem. GI recommended it would be reasonable to consider endoscopy and colonoscopy for the evaluation of anemia once patient is medically stable. Octreotide and Protonix infusions  were discontinued. This could be sec. to pancytopenia as result of bone marrow suppression from alcohol. Outpatient GI follow-up has been scheduled.  Pancreatitis He has mildly elevated lipase and CT consistent with pancreatitis. He  denies any abdominal pain,  continue supportive care. Lipase is trending down.  Hyperbilirubinemia :  Abdominal ultrasound showed no gallstones, portal hypertension, fatty infiltration.   Could be due to alcoholic liver disease. Continue supportive care,  total bilirubin remains stable.  Hypokalemia : Improved.Continue to [place].  Hyponatremia > improved. Monitor electrolytes.   Metabolic Encephalopathy : > Improved. Secondary to alcohol withdrawal Improving.  CT head negative for acute abnormality.   DVT prophylaxis: SCDs Code Status: Full code. Family Communication: Parents at bed side. Disposition Plan:   Status is: Inpatient  Remains inpatient appropriate because:Inpatient level of care appropriate due to severity of illness   Dispo: The patient is from: Home              Anticipated d/c is to: Detox facility in 1-2 days.              Patient currently is not medically stable to d/c.   Difficult to place patient No  Consultants:   GI  Procedures:  Antimicrobials:   Anti-infectives (From admission, onward)   None      Subjective: Patient was seen and examined at bedside. Overnight events noted.  Patient is more alert and awake, he is very motivated.  He has participated in physical therapy twice since morning.  Patient and family has decided to go home. Home health services been arranged.   Objective: Vitals:   06/24/20 1217 06/24/20 2042 06/25/20 0623 06/25/20 0700  BP: (!) 117/54 109/60 (!) 90/49 102/61  Pulse: 77 81 78 84  Resp: 18 16 18    Temp: 98.4 F (36.9 C) 98.9 F (37.2 C) 97.9 F (36.6 C)   TempSrc: Oral Oral Oral   SpO2: 95% 97% 98%   Weight:      Height:        Intake/Output Summary (Last 24 hours) at 06/25/2020 1324 Last data filed at 06/25/2020 0200 Gross per 24 hour  Intake 360 ml  Output --  Net 360 ml   Filed Weights   06/16/20 2050 06/17/20 1535  Weight: 85.7 kg 73.5 kg    Examination:  General exam: Appears calm  and comfortable, not in any acute distress.   Respiratory system: Clear to auscultation. Respiratory effort normal. Cardiovascular system: S1 & S2 heard, RRR. No JVD, murmurs, rubs, gallops or clicks. No pedal edema. Gastrointestinal system: Abdomen is nondistended, soft and nontender. No organomegaly or masses felt.  Normal bowel sounds heard. Central nervous system: Alert and oriented x 3 . No focal neurological deficits. Extremities: Multiple bruising noted.  Big bruise noted on the right elbow. Skin: No rashes, lesions or ulcers Psychiatry: Judgement and insight appear normal. Mood & affect appropriate.     Data Reviewed: I have personally reviewed following labs and imaging studies  CBC: Recent Labs  Lab 06/21/20 0137 06/21/20 0905 06/22/20 0336 06/23/20 0314 06/25/20 0336  WBC 5.8 5.0 4.5 4.9 5.5  HGB 8.7* 8.3* 8.5* 8.3* 9.2*  HCT 26.0* 24.8* 25.6* 25.0* 27.6*  MCV 102.0* 102.5* 101.2* 102.5* 101.5*  PLT 84* 88* 102* 128* 170   Basic Metabolic Panel: Recent Labs  Lab 06/19/20 0326 06/20/20 0337 06/21/20 0905 06/22/20 0336 06/25/20 0336  NA 135 134* 133* 135 135  K 3.8 3.8 3.4* 3.5 3.9  CL 99 99 102  103 101  CO2 28 25 24 25 26   GLUCOSE 88 91 104* 89 110*  BUN <5* 5* 7 7 6   CREATININE 0.52* 0.61 0.66 0.61 0.66  CALCIUM 9.2 9.5 9.2 9.4 9.4  MG 1.5* 1.7 1.7 1.7  --   PHOS 3.4  --  3.0 3.9  --    GFR: Estimated Creatinine Clearance: 128.3 mL/min (by C-G formula based on SCr of 0.66 mg/dL). Liver Function Tests: Recent Labs  Lab 06/19/20 0326 06/20/20 0337 06/21/20 0137 06/21/20 0905 06/22/20 0336  AST 61* 53* 47* 42* 40  ALT 29 26 24 24 22   ALKPHOS 89 98 105 102 103  BILITOT 7.7* 8.4* 8.3* 8.9* 8.4*  PROT 7.5 8.2* 8.6* 8.1 8.1  ALBUMIN 3.3* 3.4* 3.6 3.5 3.6   Recent Labs  Lab 06/19/20 0326  LIPASE 52*   No results for input(s): AMMONIA in the last 168 hours. Coagulation Profile: No results for input(s): INR, PROTIME in the last 168  hours. Cardiac Enzymes: No results for input(s): CKTOTAL, CKMB, CKMBINDEX, TROPONINI in the last 168 hours. BNP (last 3 results) No results for input(s): PROBNP in the last 8760 hours. HbA1C: No results for input(s): HGBA1C in the last 72 hours. CBG: No results for input(s): GLUCAP in the last 168 hours. Lipid Profile: No results for input(s): CHOL, HDL, LDLCALC, TRIG, CHOLHDL, LDLDIRECT in the last 72 hours. Thyroid Function Tests: No results for input(s): TSH, T4TOTAL, FREET4, T3FREE, THYROIDAB in the last 72 hours. Anemia Panel: No results for input(s): VITAMINB12, FOLATE, FERRITIN, TIBC, IRON, RETICCTPCT in the last 72 hours. Sepsis Labs: No results for input(s): PROCALCITON, LATICACIDVEN in the last 168 hours.  Recent Results (from the past 240 hour(s))  Resp Panel by RT-PCR (Flu A&B, Covid) Nasopharyngeal Swab     Status: None   Collection Time: 06/16/20  9:43 PM   Specimen: Nasopharyngeal Swab; Nasopharyngeal(NP) swabs in vial transport medium  Result Value Ref Range Status   SARS Coronavirus 2 by RT PCR NEGATIVE NEGATIVE Final    Comment: (NOTE) SARS-CoV-2 target nucleic acids are NOT DETECTED.  The SARS-CoV-2 RNA is generally detectable in upper respiratory specimens during the acute phase of infection. The lowest concentration of SARS-CoV-2 viral copies this assay can detect is 138 copies/mL. A negative result does not preclude SARS-Cov-2 infection and should not be used as the sole basis for treatment or other patient management decisions. A negative result may occur with  improper specimen collection/handling, submission of specimen other than nasopharyngeal swab, presence of viral mutation(s) within the areas targeted by this assay, and inadequate number of viral copies(<138 copies/mL). A negative result must be combined with clinical observations, patient history, and epidemiological information. The expected result is Negative.  Fact Sheet for Patients:    Fact Sheet for Healthcare Providers:  06/21/20  This test is no t yet approved or cleared by the 06/18/20 FDA and  has been authorized for detection and/or diagnosis of SARS-CoV-2 by FDA under an Emergency Use Authorization (EUA). This EUA will remain  in effect (meaning this test can be used) for the duration of the COVID-19 declaration under Section 564(b)(1) of the Act, 21 U.S.C.section 360bbb-3(b)(1), unless the authorization is terminated  or revoked sooner.       Influenza A by PCR NEGATIVE NEGATIVE Final   Influenza B by PCR NEGATIVE NEGATIVE Final    Comment: (NOTE) The Xpert Xpress SARS-CoV-2/FLU/RSV plus assay is intended as an aid in the diagnosis of influenza from Nasopharyngeal swab  specimens and should not be used as a sole basis for treatment. Nasal washings and aspirates are unacceptable for Xpert Xpress SARS-CoV-2/FLU/RSV testing.  Fact Sheet for Patients: BloggerCourse.com  Fact Sheet for Healthcare Providers: SeriousBroker.it  This test is not yet approved or cleared by the [place] FDA and has been authorized for detection and/or diagnosis of SARS-CoV-2 by FDA under an Emergency Use Authorization (EUA). This EUA will remain in effect (meaning this test can be used) for the duration of the COVID-19 declaration under Section 564(b)(1) of the Act, 21 U.S.C. section 360bbb-3(b)(1), unless the authorization is terminated or revoked.  Performed at [facility], [address], [place], [place] [address]     Radiology Studies: No results found. Scheduled Meds: . chlordiazePOXIDE  25 mg Oral TID  . folic acid  1 mg Oral Daily  . LORazepam  6 mg Intravenous Once  . multivitamin with minerals  1 tablet Oral Daily  . nicotine  14 mg Transdermal Daily  . pantoprazole  40 mg Oral BID  . sertraline  50 mg  Oral Daily  . thiamine  100 mg Oral Daily   Or  . thiamine  100 mg Intravenous Daily  . traZODone  50 mg Oral QHS   Continuous Infusions: . sodium chloride    . lactated ringers 100 mL/hr at 06/23/20 0208     LOS: 8 days    Time spent: 25 mins    Sequoia Mincey, MD [facility]   If 7PM-7AM, please contact night-coverage

## 2020-06-25 NOTE — TOC Progression Note (Signed)
Transition of Care [facility]) - Progression Note    Patient Details  Name: Nathan Osborne MRN: [id number] Date of Birth: Nov 06, 1985  Transition of Care [facility]) CM/SW Contact  Darleene Cleaver, [place] Phone Number: 06/25/2020, 10:35 AM  Clinical Narrative:     CSW spoke to [facility], they have discharged patient from their services due to patient not being completely independent.  Per [facility] patient would have to pay for a sitter at $625 a day, patient is unable to pay for this.  Patient will have to discharge home and contact [facility] to be readmitted.  Once his strength has improved patient can try for inpatient substance abuse treatment again.  CSW is working on trying to find home health for patient.     Barriers to Discharge: Barriers Resolved  Expected Discharge Plan and Services           Expected Discharge Date: 06/23/20               DME Arranged: Dan Humphreys rolling DME Agency: [employer] Date DME Agency Contacted: 06/23/20 Time DME Agency Contacted: 1318 Representative spoke with at DME Agency: Velna Hatchet             Social Determinants of Health (SDOH) Interventions    Readmission Risk Interventions No flowsheet data found.

## 2020-06-25 NOTE — Plan of Care (Signed)
  Problem: Education: Goal: Knowledge of General Education information will improve Description: Including pain rating scale, medication(s)/side effects and non-pharmacologic comfort measures 06/25/2020 1345 by Charmian Muff, RN Outcome: Adequate for Discharge 06/25/2020 1345 by Charmian Muff, RN Outcome: Progressing   Problem: Health Behavior/Discharge Planning: Goal: Ability to manage health-related needs will improve 06/25/2020 1345 by Charmian Muff, RN Outcome: Adequate for Discharge 06/25/2020 1345 by Charmian Muff, RN Outcome: Progressing   Problem: Clinical Measurements: Goal: Ability to maintain clinical measurements within normal limits will improve Outcome: Adequate for Discharge Goal: Will remain free from infection Outcome: Adequate for Discharge Goal: Diagnostic test results will improve Outcome: Adequate for Discharge Goal: Respiratory complications will improve Outcome: Adequate for Discharge Goal: Cardiovascular complication will be avoided Outcome: Adequate for Discharge   Problem: Activity: Goal: Risk for activity intolerance will decrease Outcome: Adequate for Discharge   Problem: Nutrition: Goal: Adequate nutrition will be maintained Outcome: Adequate for Discharge   Problem: Coping: Goal: Level of anxiety will decrease Outcome: Adequate for Discharge   Problem: Elimination: Goal: Will not experience complications related to bowel motility Outcome: Adequate for Discharge Goal: Will not experience complications related to urinary retention Outcome: Adequate for Discharge   Problem: Pain Managment: Goal: General experience of comfort will improve Outcome: Adequate for Discharge   Problem: Safety: Goal: Ability to remain free from injury will improve Outcome: Adequate for Discharge   Problem: Skin Integrity: Goal: Risk for impaired skin integrity will decrease Outcome: Adequate for Discharge   Problem: Acute Rehab OT Goals (only OT  should resolve) Goal: Pt. Will Perform Lower Body Dressing Outcome: Adequate for Discharge Goal: Pt. Will Transfer To Toilet Outcome: Adequate for Discharge Goal: Pt. Will Perform Toileting-Clothing Manipulation Outcome: Adequate for Discharge Goal: OT Additional ADL Goal #1 Outcome: Adequate for Discharge   Problem: Acute Rehab PT Goals(only PT should resolve) Goal: Pt Will Go Supine/Side To Sit Outcome: Adequate for Discharge Goal: Patient Will Transfer Sit To/From Stand Outcome: Adequate for Discharge Goal: Pt Will Ambulate Outcome: Adequate for Discharge

## 2020-06-25 NOTE — Plan of Care (Signed)
  Problem: Education: Goal: Knowledge of General Education information will improve Description Including pain rating scale, medication(s)/side effects and non-pharmacologic comfort measures Outcome: Progressing   Problem: Health Behavior/Discharge Planning: Goal: Ability to manage health-related needs will improve Outcome: Progressing   

## 2020-06-25 NOTE — Progress Notes (Signed)
Pt has increased ambulation past 24hrs,this am c/o of pain in upper left thigh area 8/10, Tylendol given for pain. Will cont to monitor and encourage ambulation.

## 2022-01-18 IMAGING — CT CT HEAD W/O CM
4 series · 16 of 47 positions shown, 18 images · non-contrast
Comparison: None.

CLINICAL DATA: Fall, facial trauma, facial laceration, altered
mental status

EXAM:
CT HEAD WITHOUT CONTRAST
CT MAXILLOFACIAL WITHOUT CONTRAST
TECHNIQUE: Multidetector CT imaging of the head and maxillofacial structures
were performed using the standard protocol without intravenous
contrast. Multiplanar CT image reconstructions of the maxillofacial
structures were also generated.

[Series 3: head wo · axial · 0.47mm/px · z∈[-68,+47]mm · 7 of 31 slices shown, 9 images]
[im 4/31  brain]
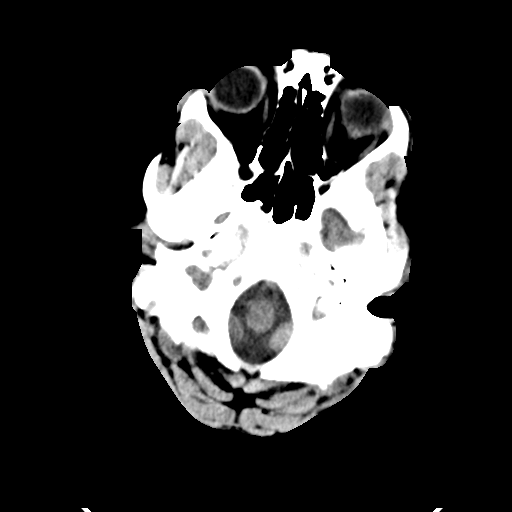
[im 4/31  bone]
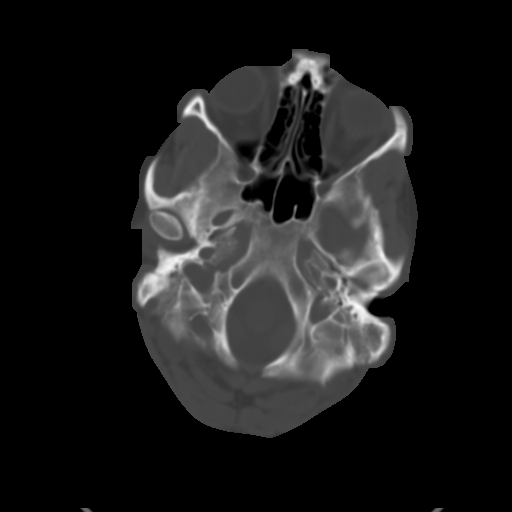
[im 8/31  brain]
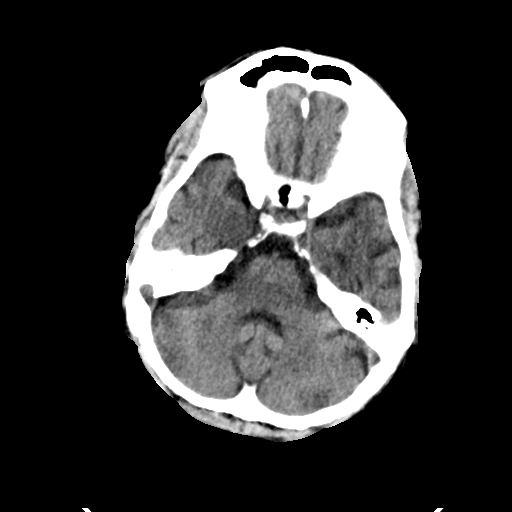
[im 12/31  brain]
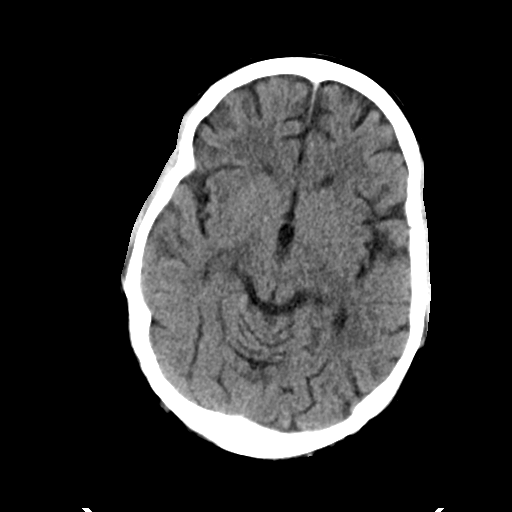
[im 16/31  brain]
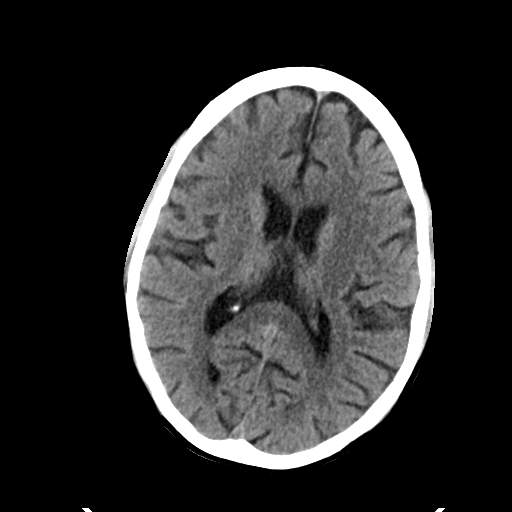
[im 19/31  brain]
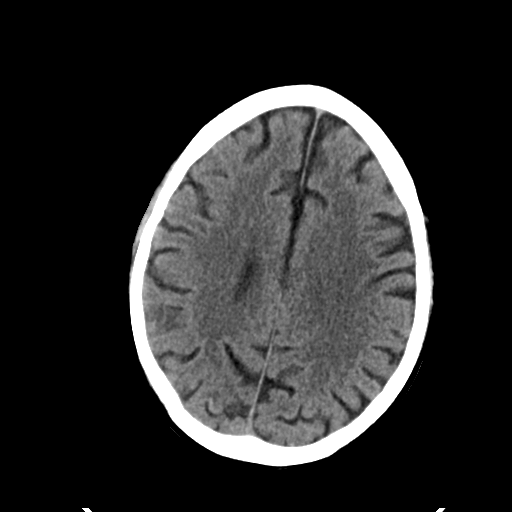
[im 19/31  bone]
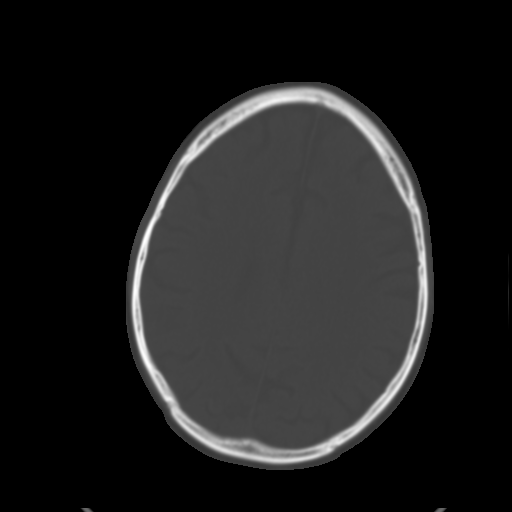
[im 23/31  brain]
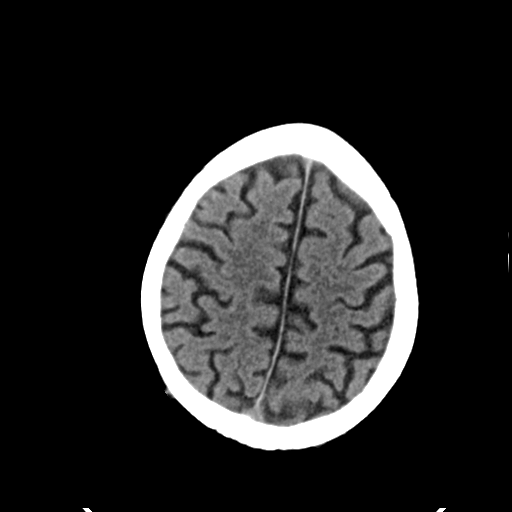
[im 27/31  brain]
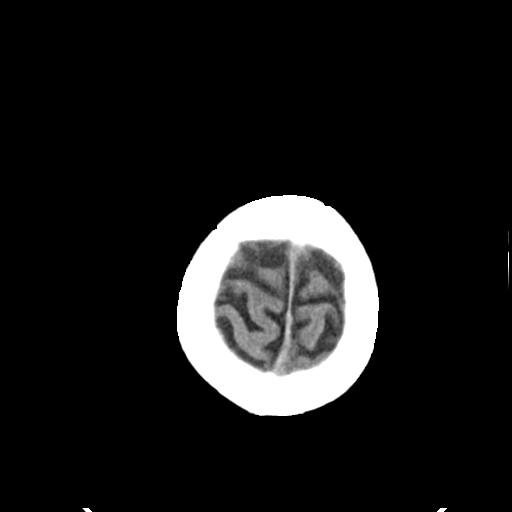

[Series 4: head bone · axial · 0.47mm/px · z∈[-69,-39]mm · 3 of 76 slices shown]
[im 8/76  bone]
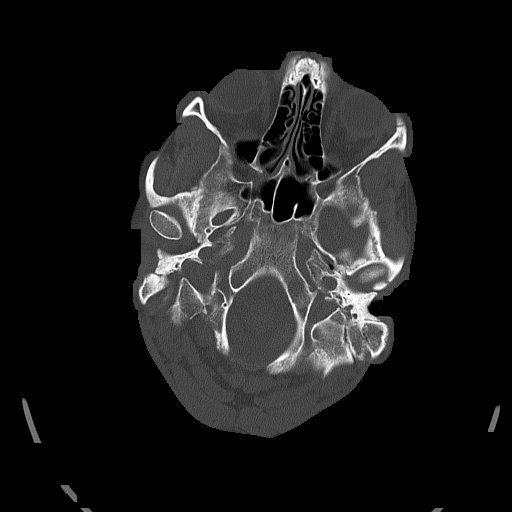
[im 16/76  bone]
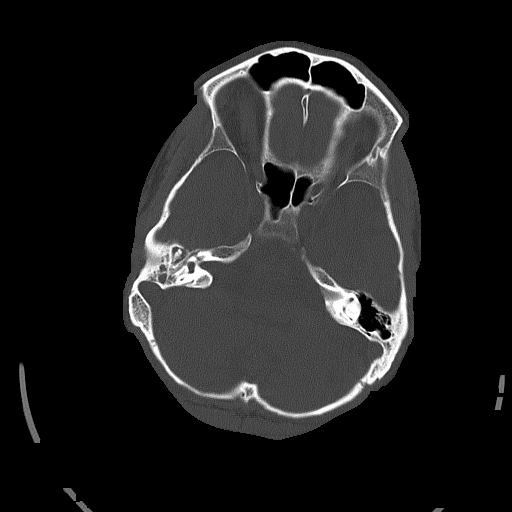
[im 23/76  bone]
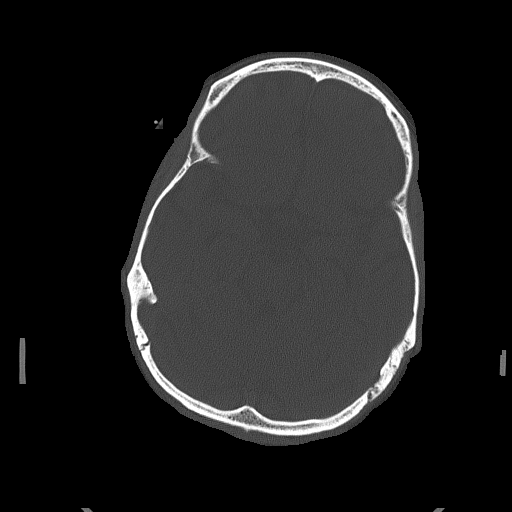

[Series 5: coronal soft tissue · coronal · 0.29mm/px · 3 of 68 slices shown]
[im 23/68  brain]
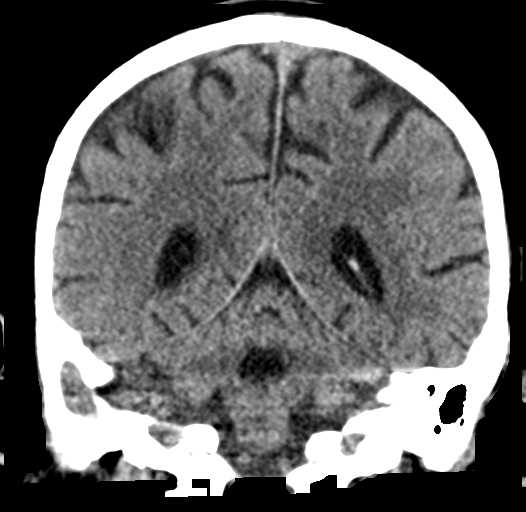
[im 30/68  brain]
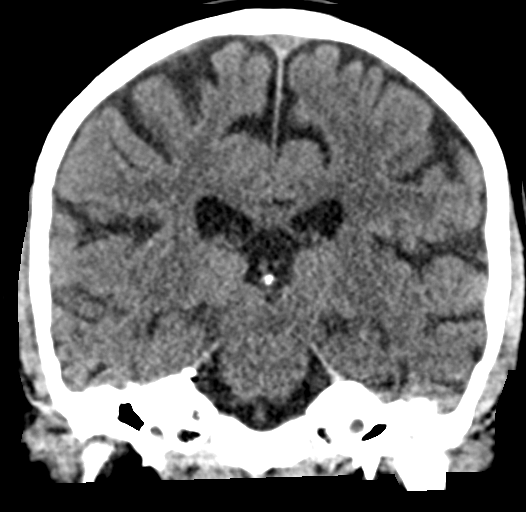
[im 38/68  brain]
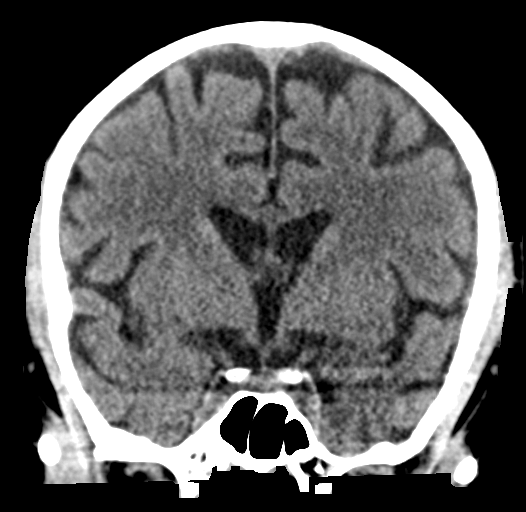

[Series 6: sagittal soft tissue · sagittal · 0.29mm/px · 3 of 50 slices shown]
[im 17/50  brain]
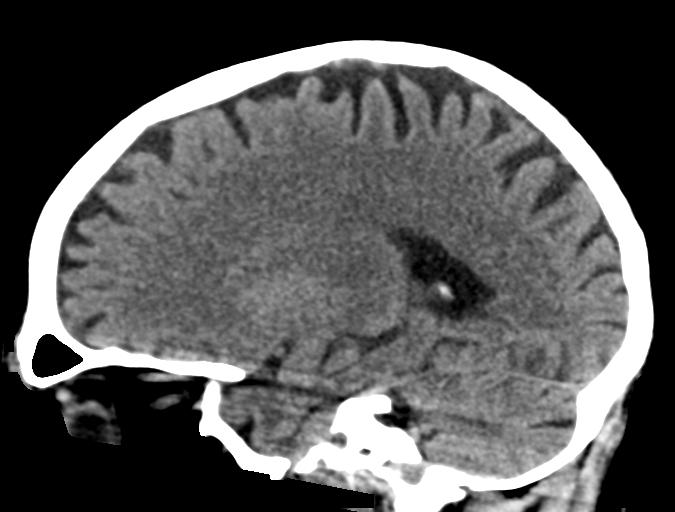
[im 25/50  brain]
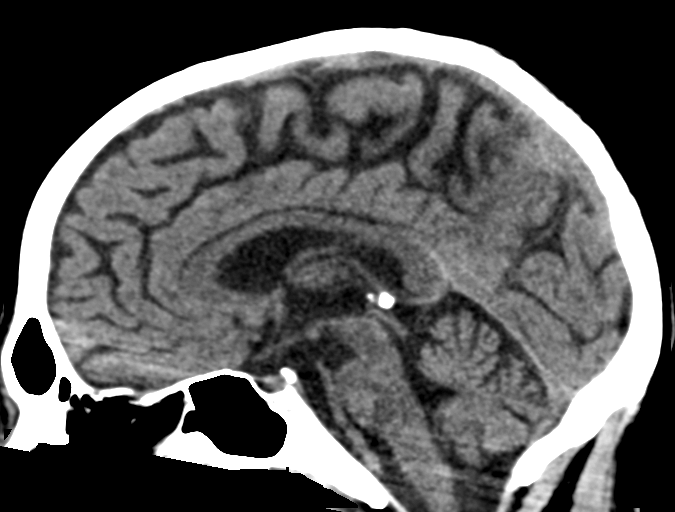
[im 33/50  brain]
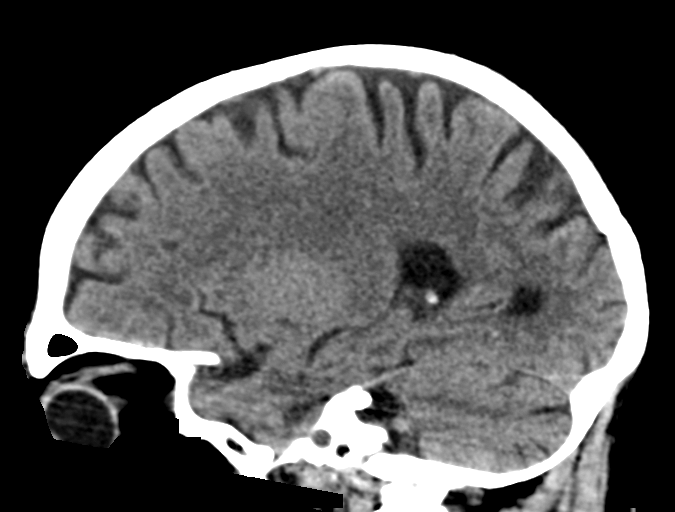

[16 of 47 positions shown; findings below may reference images not displayed]

FINDINGS: CT HEAD FINDINGS

Brain: There is mild diffuse cerebral atrophy, greater than would be
expected for patient age. No abnormal intra or extra-axial mass
lesion or fluid collection. No abnormal mass effect or midline
shift. No evidence of acute intracranial hemorrhage or infarct.
Ventricular size is normal. Cerebellum unremarkable.

Vascular: Unremarkable

Skull: Intact

Sinuses/Orbits: Paranasal sinuses are clear. Orbits are
unremarkable.

Other: There is fluid opacification of the right mastoid air cells
and middle ear cavity. Left mastoid air cells and middle ear cavity
are clear.

CT MAXILLOFACIAL FINDINGS

Osseous: No fracture or mandibular dislocation. No destructive
process.

Orbits: Negative. No traumatic or inflammatory finding.

Sinuses: Clear.

Soft tissues: There is mild soft tissue swelling and subcutaneous
gas seen superficial to the mandibular mentum.
IMPRESSION: No acute intracranial injury.  No calvarial fracture.

Mild diffuse cerebral atrophy, greater than would be expected for
patient age.

No acute facial fracture. Mild soft tissue swelling superficial to
the mandibular mentum.
